# Patient Record
Sex: Female | Born: 1955
Health system: Southern US, Community
[De-identification: ages and names within clinical notes are randomized; demographics above are authoritative.]

## PROBLEM LIST (undated history)

## (undated) DIAGNOSIS — Z9889 Other specified postprocedural states: Secondary | ICD-10-CM

## (undated) DIAGNOSIS — R112 Nausea with vomiting, unspecified: Secondary | ICD-10-CM

## (undated) DIAGNOSIS — M810 Age-related osteoporosis without current pathological fracture: Secondary | ICD-10-CM

## (undated) DIAGNOSIS — E785 Hyperlipidemia, unspecified: Secondary | ICD-10-CM

## (undated) DIAGNOSIS — E2839 Other primary ovarian failure: Secondary | ICD-10-CM

## (undated) DIAGNOSIS — S92253A Displaced fracture of navicular [scaphoid] of unspecified foot, initial encounter for closed fracture: Secondary | ICD-10-CM

## (undated) HISTORY — PX: OTHER SURGICAL HISTORY: SHX169

## (undated) HISTORY — DX: Displaced fracture of navicular (scaphoid) of unspecified foot, initial encounter for closed fracture: S92.253A

## (undated) HISTORY — DX: Nausea with vomiting, unspecified: Z98.890

## (undated) HISTORY — DX: Other specified postprocedural states: R11.2

## (undated) HISTORY — PX: COLONOSCOPY: SHX174

## (undated) HISTORY — DX: Hyperlipidemia, unspecified: E78.5

## (undated) HISTORY — DX: Other primary ovarian failure: E28.39

## (undated) HISTORY — DX: Age-related osteoporosis without current pathological fracture: M81.0

---

## 2015-05-02 LAB — HM PAP SMEAR

## 2015-05-08 LAB — HM MAMMOGRAPHY

## 2016-05-08 LAB — HM MAMMOGRAPHY

## 2016-05-08 LAB — HM PAP SMEAR: HM PAP: NORMAL

## 2016-06-17 DIAGNOSIS — S92253A Displaced fracture of navicular [scaphoid] of unspecified foot, initial encounter for closed fracture: Secondary | ICD-10-CM

## 2016-06-17 HISTORY — DX: Displaced fracture of navicular (scaphoid) of unspecified foot, initial encounter for closed fracture: S92.253A

## 2017-10-13 ENCOUNTER — Encounter: Payer: Self-pay | Admitting: Family Medicine

## 2017-10-13 ENCOUNTER — Ambulatory Visit: Payer: 59 | Admitting: Family Medicine

## 2017-10-13 VITALS — BP 128/78 | HR 68 | Ht 65.5 in | Wt 117.4 lb

## 2017-10-13 DIAGNOSIS — E2839 Other primary ovarian failure: Secondary | ICD-10-CM | POA: Diagnosis not present

## 2017-10-13 DIAGNOSIS — Z1239 Encounter for other screening for malignant neoplasm of breast: Secondary | ICD-10-CM

## 2017-10-13 DIAGNOSIS — M8580 Other specified disorders of bone density and structure, unspecified site: Secondary | ICD-10-CM | POA: Diagnosis not present

## 2017-10-13 DIAGNOSIS — Z0000 Encounter for general adult medical examination without abnormal findings: Secondary | ICD-10-CM | POA: Diagnosis not present

## 2017-10-13 DIAGNOSIS — Z114 Encounter for screening for human immunodeficiency virus [HIV]: Secondary | ICD-10-CM

## 2017-10-13 DIAGNOSIS — Z1159 Encounter for screening for other viral diseases: Secondary | ICD-10-CM

## 2017-10-13 DIAGNOSIS — Z1231 Encounter for screening mammogram for malignant neoplasm of breast: Secondary | ICD-10-CM | POA: Diagnosis not present

## 2017-10-13 LAB — POCT URINALYSIS DIP (PROADVANTAGE DEVICE)
BILIRUBIN UA: NEGATIVE
GLUCOSE UA: NEGATIVE mg/dL
Ketones, POC UA: NEGATIVE mg/dL
LEUKOCYTES UA: NEGATIVE
NITRITE UA: NEGATIVE
PH UA: 8 (ref 5.0–8.0)
Protein Ur, POC: NEGATIVE mg/dL
RBC UA: NEGATIVE
Specific Gravity, Urine: 1.01
UUROB: 3.5

## 2017-10-13 NOTE — Progress Notes (Signed)
Subjective:    Patient ID: Deborah Wright, female    DOB: 1956/06/30, 61 y.o.   MRN: [id number]  HPI Chief Complaint  Patient presents with  . Annual Exam    fasting cpe   She is new to the practice and here for a complete physical exam. Previous medical care: OB/GYN in [place]: years ago.   Reports history of osteopenia years ago and has not had a repeat bone density study.  States she is having to do with aging parents in their decline in health but is not having to actually take care of them at this point. Denies feeling depressed or sad.  Reports having additional stress related to her parents health.  Other providers: Dr. Fabio Asa with [facility] - OB/GYN  Dr Glorious Peach- dentist   Social history: Lives with huband, works as Physiological scientist, Art gallery manager  Diet: healthy diet  Excerise: daily   Immunizations: flu shot last month. Tdap 2011 or 20112.    Health maintenance:  Mammogram: 2017  Colonoscopy: 6 -7 years ago  Last Pap Smear: 2017  DEXA- years ago. Osteopenia.  Last Menstrual cycle: 10 years ago Last Dental Exam: twice annually  Last Eye Exam: in [place] 2.5-3 years ago  Wears seatbelt always, uses sunscreen, smoke detectors in home and functioning, does not text while driving and feels safe in home environment.   Reviewed allergies, medications, past medical, surgical, family, and social history.    Review of Systems Review of Systems Constitutional: -fever, -chills, -sweats, -unexpected weight change,-fatigue ENT: -runny nose, -ear pain, -sore throat Cardiology:  -chest pain, -palpitations, -edema Respiratory: -cough, -shortness of breath, -wheezing Gastroenterology: -abdominal pain, -nausea, -vomiting, -diarrhea, -constipation  Hematology: -bleeding or bruising problems Musculoskeletal: -arthralgias, -myalgias, -joint swelling, -back pain Ophthalmology: -vision changes Urology: -dysuria, -difficulty  urinating, -hematuria, -urinary frequency, -urgency Neurology: -headache, -weakness, -tingling, -numbness       Objective:   Physical Exam BP 128/78   Pulse 68   Ht 5' 5.5" (1.664 m)   Wt 117 lb 6.4 oz (53.3 kg)   SpO2 99%   BMI 19.24 kg/m   General Appearance:    Alert, cooperative, thin, no distress, appears stated age  Head:    Normocephalic, without obvious abnormality, atraumatic  Eyes:    PERRL, conjunctiva/corneas clear, EOM's intact, fundi    benign  Ears:    Normal TM's and external ear canals  Nose:   Nares normal, mucosa normal, no drainage or sinus   tenderness  Throat:   Lips, mucosa, and tongue normal; teeth and gums normal  Neck:   Supple, no lymphadenopathy;  thyroid:  no   enlargement/tenderness/nodules; no carotid   bruit or JVD  Back:    Spine nontender, no curvature, ROM normal, no CVA     tenderness  Lungs:     Clear to auscultation bilaterally without wheezes, rales or     ronchi; respirations unlabored  Chest Wall:    No tenderness or deformity   Heart:    Regular rate and rhythm, S1 and S2 normal, no murmur, rub   or gallop  Breast Exam:   Declines.  Mammogram ordered.  Abdomen:     Soft, non-tender, nondistended, normoactive bowel sounds,    no masses, no hepatosplenomegaly  Genitalia:   Declines.  Pap smear up-to-date in 2018.  Rectal:    Normal tone, no masses or tenderness; guaiac negative stool  Extremities:   No clubbing, cyanosis or edema  Pulses:  2+ and symmetric all extremities  Skin:   Skin color, texture, turgor normal, no rashes or lesions  Lymph nodes:   Cervical, supraclavicular, and axillary nodes normal  Neurologic:   CNII-XII intact, normal strength, sensation and gait; reflexes 2+ and symmetric throughout          Psych:   Normal mood, affect, hygiene and grooming.     Urinalysis dipstick: neg       Assessment & Plan:  Routine general medical examination at a health care facility - Plan: CBC with Differential/Platelet,  Comprehensive metabolic panel, POCT Urinalysis DIP (Proadvantage Device), TSH, Lipid panel  Osteopenia determined by x-ray - Plan: DG Bone Density  Estrogen deficiency - Plan: DG Bone Density  Screening for breast cancer - Plan: MM DIGITAL SCREENING BILATERAL  Need for hepatitis C screening test - Plan: Hepatitis C antibody  Screening for HIV without presence of risk factors - Plan: HIV antibody  She appears to be doing well physically and emotionally.  Under stress due to parents declining health.  Offered counseling she declines. Mammogram ordered.  Bone density ordered.  She does have a history of osteopenia and needs reevaluated. Pap smear up-to-date per patient.  Will attempt to get records from her OB/GYN in [place]. Colonoscopy up-to-date per patient.  Will attempt to get these records. Discussed safety and health promotion. One-time hepatitis C screening test and HIV screening done per guidelines. Up-to-date with Tdap and flu shot. Counseled on shingles vaccine and she will check with her insurance. Follow-up pending labs.

## 2017-10-13 NOTE — Patient Instructions (Signed)

## 2017-10-14 ENCOUNTER — Telehealth: Payer: Self-pay | Admitting: Family Medicine

## 2017-10-14 LAB — COMPREHENSIVE METABOLIC PANEL
AG Ratio: 1.8 (calc) (ref 1.0–2.5)
ALT: 16 U/L (ref 6–29)
AST: 29 U/L (ref 10–35)
Albumin: 4.6 g/dL (ref 3.6–5.1)
Alkaline phosphatase (APISO): 55 U/L (ref 33–130)
BUN: 9 mg/dL (ref 7–25)
CALCIUM: 9.9 mg/dL (ref 8.6–10.4)
CO2: 31 mmol/L (ref 20–32)
Chloride: 96 mmol/L — ABNORMAL LOW (ref 98–110)
Creat: 0.89 mg/dL (ref 0.50–1.05)
GLUCOSE: 97 mg/dL (ref 65–99)
Globulin: 2.6 g/dL (calc) (ref 1.9–3.7)
Potassium: 4.7 mmol/L (ref 3.5–5.3)
Sodium: 135 mmol/L (ref 135–146)
TOTAL PROTEIN: 7.2 g/dL (ref 6.1–8.1)
Total Bilirubin: 1 mg/dL (ref 0.2–1.2)

## 2017-10-14 LAB — CBC WITH DIFFERENTIAL/PLATELET
BASOS ABS: 40 {cells}/uL (ref 0–200)
Basophils Relative: 0.7 %
EOS ABS: 103 {cells}/uL (ref 15–500)
EOS PCT: 1.8 %
HCT: 41.9 % (ref 35.0–45.0)
Hemoglobin: 14.7 g/dL (ref 11.7–15.5)
Lymphs Abs: 2155 cells/uL (ref 850–3900)
MCH: 31.5 pg (ref 27.0–33.0)
MCHC: 35.1 g/dL (ref 32.0–36.0)
MCV: 89.7 fL (ref 80.0–100.0)
MONOS PCT: 6.8 %
MPV: 10.4 fL (ref 7.5–12.5)
NEUTROS PCT: 52.9 %
Neutro Abs: 3015 cells/uL (ref 1500–7800)
Platelets: 203 10*3/uL (ref 140–400)
RBC: 4.67 10*6/uL (ref 3.80–5.10)
RDW: 12.1 % (ref 11.0–15.0)
TOTAL LYMPHOCYTE: 37.8 %
WBC mixed population: 388 cells/uL (ref 200–950)
WBC: 5.7 10*3/uL (ref 3.8–10.8)

## 2017-10-14 LAB — HEPATITIS C ANTIBODY
Hepatitis C Ab: NONREACTIVE
SIGNAL TO CUT-OFF: 0.01 (ref <1.00)

## 2017-10-14 LAB — HIV ANTIBODY (ROUTINE TESTING W REFLEX): HIV: NONREACTIVE

## 2017-10-14 LAB — LIPID PANEL
CHOLESTEROL: 233 mg/dL — AB (ref <200)
HDL: 104 mg/dL (ref >50)
LDL Cholesterol (Calc): 116 mg/dL (calc) — ABNORMAL HIGH
Non-HDL Cholesterol (Calc): 129 mg/dL (calc) (ref <130)
TRIGLYCERIDES: 51 mg/dL (ref <150)
Total CHOL/HDL Ratio: 2.2 (calc) (ref <5.0)

## 2017-10-14 LAB — TSH: TSH: 2.45 mIU/L (ref 0.40–4.50)

## 2017-10-14 NOTE — Telephone Encounter (Signed)
Records received from Dr. Halford Chessman [facility] Sent back in folder

## 2017-11-03 ENCOUNTER — Encounter: Payer: Self-pay | Admitting: Family Medicine

## 2017-11-10 ENCOUNTER — Ambulatory Visit
Admission: RE | Admit: 2017-11-10 | Discharge: 2017-11-10 | Disposition: A | Discharge status: Home / Self Care | Payer: 59 | Source: Ambulatory Visit | Attending: Family Medicine | Admitting: Family Medicine

## 2017-11-10 DIAGNOSIS — Z1239 Encounter for other screening for malignant neoplasm of breast: Secondary | ICD-10-CM

## 2017-11-10 DIAGNOSIS — Z1231 Encounter for screening mammogram for malignant neoplasm of breast: Secondary | ICD-10-CM | POA: Diagnosis not present

## 2017-11-10 DIAGNOSIS — E2839 Other primary ovarian failure: Secondary | ICD-10-CM

## 2017-11-10 DIAGNOSIS — M8580 Other specified disorders of bone density and structure, unspecified site: Secondary | ICD-10-CM

## 2017-11-10 DIAGNOSIS — M810 Age-related osteoporosis without current pathological fracture: Secondary | ICD-10-CM | POA: Diagnosis not present

## 2017-11-10 DIAGNOSIS — Z780 Asymptomatic menopausal state: Secondary | ICD-10-CM | POA: Diagnosis not present

## 2017-11-14 ENCOUNTER — Other Ambulatory Visit: Payer: Self-pay

## 2017-11-14 ENCOUNTER — Ambulatory Visit: Payer: Self-pay

## 2017-11-27 ENCOUNTER — Encounter: Payer: Self-pay | Admitting: Family Medicine

## 2017-12-28 DIAGNOSIS — M7022 Olecranon bursitis, left elbow: Secondary | ICD-10-CM | POA: Diagnosis not present

## 2017-12-31 DIAGNOSIS — D225 Melanocytic nevi of trunk: Secondary | ICD-10-CM | POA: Diagnosis not present

## 2017-12-31 DIAGNOSIS — L821 Other seborrheic keratosis: Secondary | ICD-10-CM | POA: Diagnosis not present

## 2017-12-31 DIAGNOSIS — D2262 Melanocytic nevi of left upper limb, including shoulder: Secondary | ICD-10-CM | POA: Diagnosis not present

## 2018-04-24 IMAGING — MG DIGITAL SCREENING BILATERAL MAMMOGRAM WITH CAD
5 series · 5 of 5 positions shown · non-contrast
Comparison: None.

ADDENDUM:
Patient's prior imaging has become available for comparison. There
has been no significant interval change. There is no change to the
current impression, recommendation or BI-RADS category.
CLINICAL DATA: Screening.

EXAM:
DIGITAL SCREENING BILATERAL MAMMOGRAM WITH CAD

[R CC]
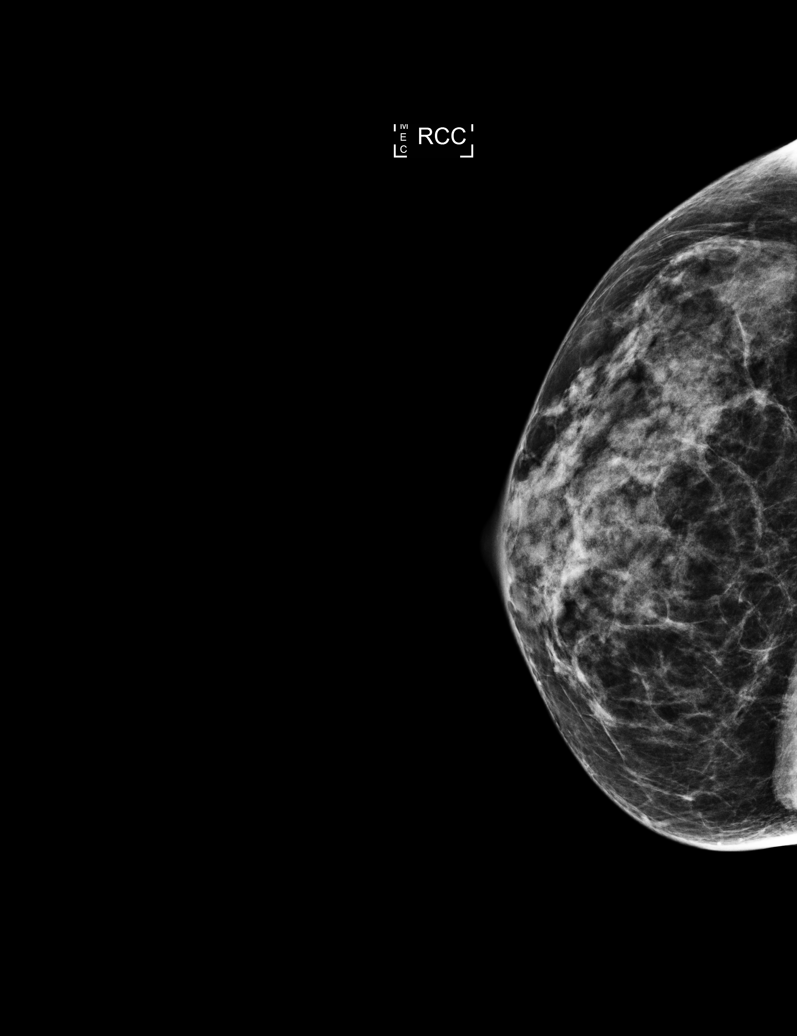

[R MLO (1 of 2)]
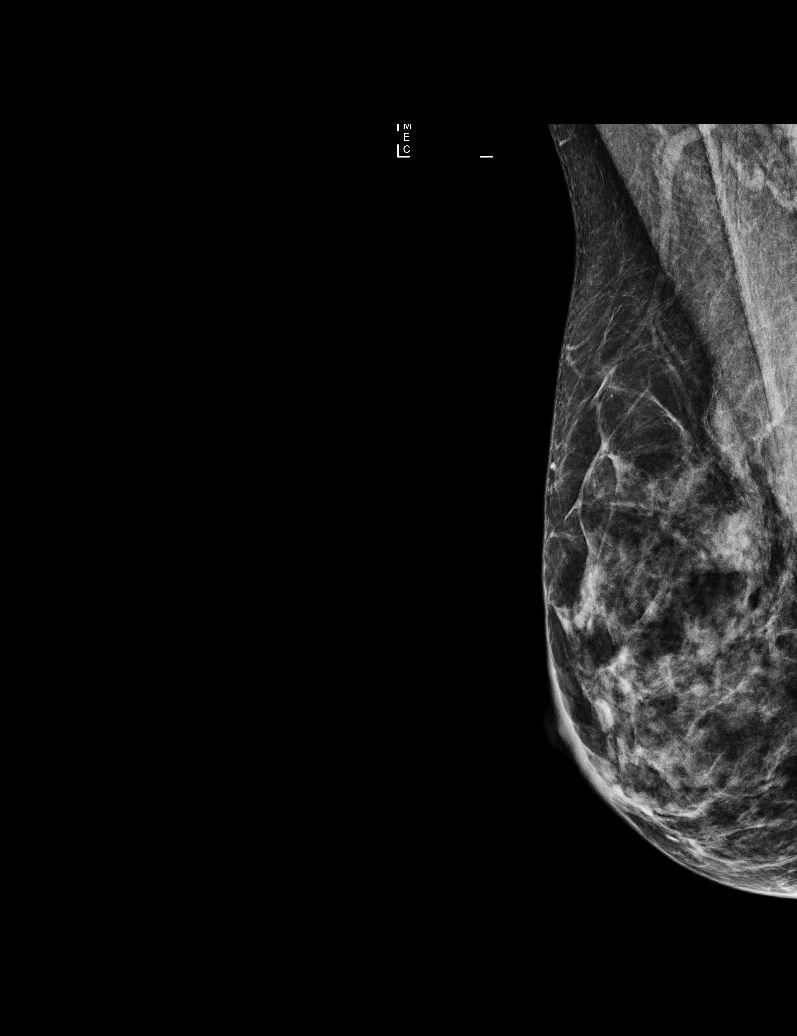

[R MLO (2 of 2)]
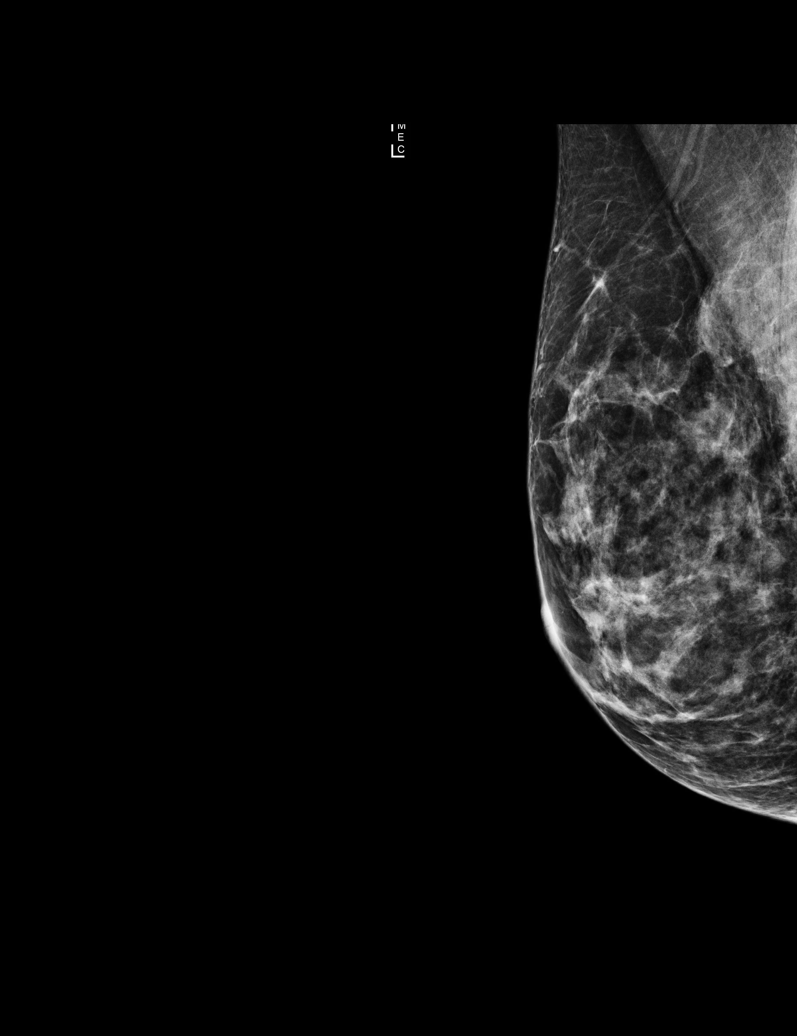

[L CC]
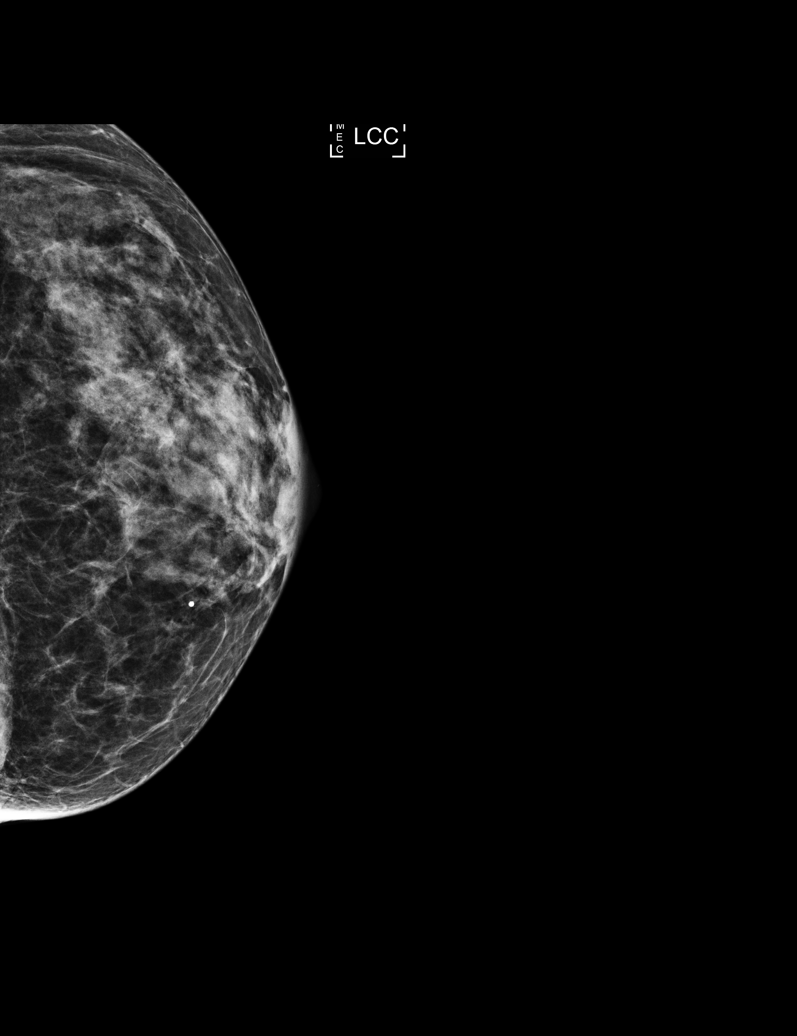

[L MLO]
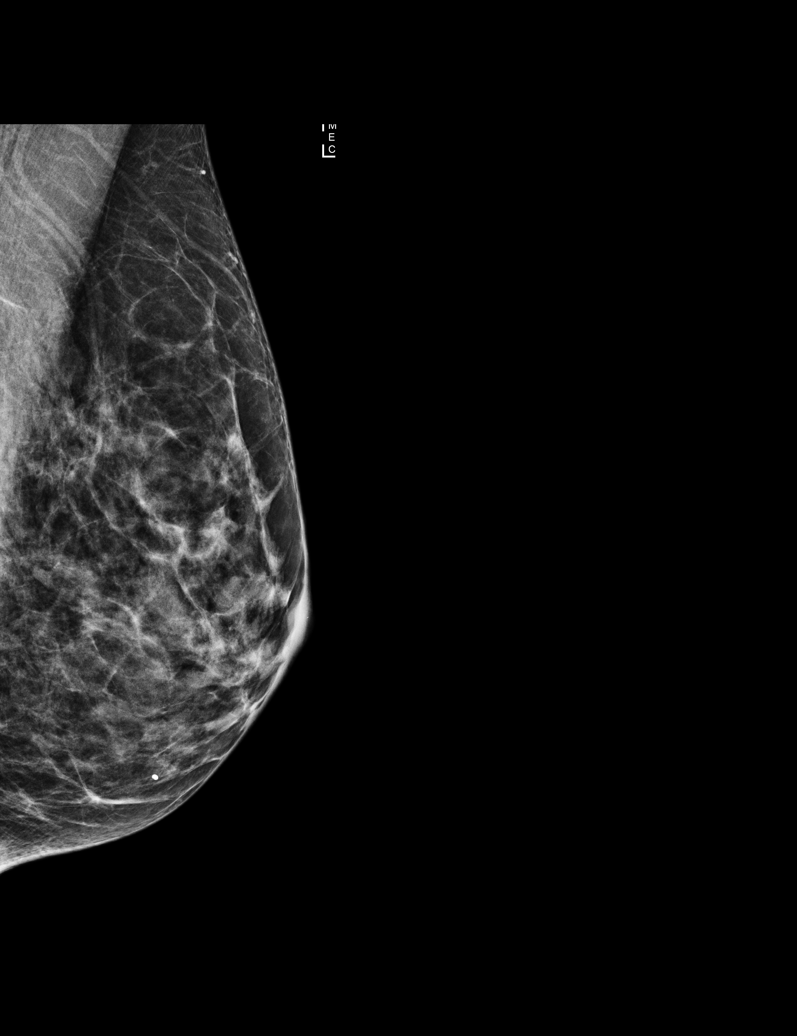

[5 of 5 positions shown; findings below may reference images not displayed]

ACR Breast Density Category c: The breast tissue is heterogeneously
dense, which may obscure small masses
FINDINGS: There are no findings suspicious for malignancy. Images were
processed with CAD.
IMPRESSION: No mammographic evidence of malignancy. A result letter of this
screening mammogram will be mailed directly to the patient.

RECOMMENDATION:
Screening mammogram in one year. (Code:T9-J-009)

BI-RADS CATEGORY  1: Negative.

## 2018-04-30 ENCOUNTER — Encounter: Payer: Self-pay | Admitting: Family Medicine

## 2018-05-14 DIAGNOSIS — H00014 Hordeolum externum left upper eyelid: Secondary | ICD-10-CM | POA: Diagnosis not present

## 2018-07-03 ENCOUNTER — Encounter: Payer: Self-pay | Admitting: Surgical

## 2018-07-27 NOTE — Progress Notes (Signed)
Deborah Wright is a 62 y.o. female is here to [place].   Patient Care Team: Briscoe Deutscher, DO as PCP - General (Family Medicine)   History of Present Illness:   HPI: Healthy. Teaches yoga. Eats well. Dx of OP but declines medication. Feels strong. Good balance.   Health Maintenance Due  Topic Date Due  . COLONOSCOPY  06/05/2008  . INFLUENZA VACCINE  06/18/2018   Depression screen PHQ 2/9 10/13/2017  Decreased Interest 1  Down, Depressed, Hopeless 1  PHQ - 2 Score 2   PMHx, SurgHx, SocialHx, Medications, and Allergies were reviewed in the Visit Navigator and updated as appropriate.   Past Medical History:  Diagnosis Date  . Closed fracture of navicular bone of foot 06/17/2016  . Estrogen deficiency   . Osteoporosis    History reviewed. No pertinent surgical history.   Family History  Problem Relation Age of Onset  . Pulmonary fibrosis Mother   . Osteoarthritis Mother   . Chronic Renal Failure Mother   . Osteoporosis Mother   . Arthritis Maternal Grandmother   . COPD Maternal Grandmother   . Alcohol abuse Maternal Grandfather   . Early death Maternal Grandfather   . Breast cancer Neg Hx    Social History   Tobacco Use  . Smoking status: Never Smoker  . Smokeless tobacco: Never Used  Substance Use Topics  . Alcohol use: Yes    Comment: 1-2 glasses of wine nightly   . Drug use: No    Current Medications and Allergies:   Current Outpatient Medications:  .  bimatoprost (LATISSE) 0.03 % ophthalmic solution, APPLY TO AFFECTED AREA EVERY DAY, Disp: , Rfl: 3   Allergies  Allergen Reactions  . Demerol [Meperidine] Nausea And Vomiting  . Versed [Midazolam] Nausea And Vomiting   Review of Systems:   Pertinent items are noted in the HPI. Otherwise, ROS is negative.  Vitals:   Vitals:   07/28/18 1500  BP: 104/66  Pulse: 70  Temp: 98.4 F (36.9 C)  TempSrc: Oral  SpO2: 98%  Weight: 114 lb 6.4 oz (51.9 kg)  Height: 5' 5.5" (1.664 m)     Body  mass index is 18.75 kg/m.  Physical Exam:   Physical Exam  Constitutional: She appears well-nourished.  HENT:  Head: Normocephalic and atraumatic.  Eyes: Pupils are equal, round, and reactive to light. EOM are normal.  Neck: Normal range of motion. Neck supple.  Cardiovascular: Normal rate, regular rhythm, normal heart sounds and intact distal pulses.  Pulmonary/Chest: Effort normal.  Abdominal: Soft.  Skin: Skin is warm.  Psychiatric: She has a normal mood and affect. Her behavior is normal.  Nursing note and vitals reviewed.  Results for orders placed or performed in visit on 11/03/17  HM PAP SMEAR  Result Value Ref Range   HM Pap smear norma    Assessment and Plan:   [name] was seen today for establish care.  Diagnoses and all orders for this visit:  Localized osteoporosis without current pathological fracture Comments: Doing well. Continue calcium and vitamin D. Continue strength training.     . Reviewed expectations re: course of current medical issues. . Discussed self-management of symptoms. . Outlined signs and symptoms indicating need for more acute intervention. . Patient verbalized understanding and all questions were answered. Marland Kitchen Health Maintenance issues including appropriate healthy diet, exercise, and smoking avoidance were discussed with patient. . See orders for this visit as documented in the electronic medical record. . Patient received an After  Visit Summary.  Briscoe Deutscher, DO , [facility] 08/03/2018

## 2018-07-28 ENCOUNTER — Encounter: Payer: Self-pay | Admitting: Family Medicine

## 2018-07-28 ENCOUNTER — Ambulatory Visit (INDEPENDENT_AMBULATORY_CARE_PROVIDER_SITE_OTHER): Payer: 59 | Admitting: Family Medicine

## 2018-07-28 VITALS — BP 104/66 | HR 70 | Temp 98.4°F | Ht 65.5 in | Wt 114.4 lb

## 2018-07-28 DIAGNOSIS — M816 Localized osteoporosis [Lequesne]: Secondary | ICD-10-CM | POA: Diagnosis not present

## 2018-08-03 ENCOUNTER — Encounter: Payer: Self-pay | Admitting: Family Medicine

## 2018-08-03 DIAGNOSIS — M816 Localized osteoporosis [Lequesne]: Secondary | ICD-10-CM | POA: Insufficient documentation

## 2019-01-05 ENCOUNTER — Other Ambulatory Visit: Payer: Self-pay | Admitting: Family Medicine

## 2019-01-05 DIAGNOSIS — Z1231 Encounter for screening mammogram for malignant neoplasm of breast: Secondary | ICD-10-CM

## 2019-01-07 DIAGNOSIS — L570 Actinic keratosis: Secondary | ICD-10-CM | POA: Diagnosis not present

## 2019-01-07 DIAGNOSIS — L821 Other seborrheic keratosis: Secondary | ICD-10-CM | POA: Diagnosis not present

## 2019-01-07 DIAGNOSIS — Q825 Congenital non-neoplastic nevus: Secondary | ICD-10-CM | POA: Diagnosis not present

## 2019-01-28 ENCOUNTER — Other Ambulatory Visit: Payer: Self-pay

## 2019-01-28 ENCOUNTER — Ambulatory Visit
Admission: RE | Admit: 2019-01-28 | Discharge: 2019-01-28 | Disposition: A | Discharge status: Home / Self Care | Payer: 59 | Source: Ambulatory Visit | Attending: Family Medicine | Admitting: Family Medicine

## 2019-01-28 DIAGNOSIS — Z1231 Encounter for screening mammogram for malignant neoplasm of breast: Secondary | ICD-10-CM

## 2019-03-30 ENCOUNTER — Encounter: Payer: Self-pay | Admitting: Family Medicine

## 2019-03-31 ENCOUNTER — Other Ambulatory Visit: Payer: Self-pay

## 2019-03-31 ENCOUNTER — Ambulatory Visit (INDEPENDENT_AMBULATORY_CARE_PROVIDER_SITE_OTHER): Payer: 59 | Admitting: Family Medicine

## 2019-03-31 ENCOUNTER — Encounter: Payer: Self-pay | Admitting: Family Medicine

## 2019-03-31 VITALS — Ht 65.5 in | Wt 114.0 lb

## 2019-03-31 DIAGNOSIS — L255 Unspecified contact dermatitis due to plants, except food: Secondary | ICD-10-CM | POA: Diagnosis not present

## 2019-03-31 MED ORDER — CLOBETASOL PROPIONATE 0.05 % EX OINT: 1.0000 "application " | TOPICAL_OINTMENT | Freq: Two times a day (BID) | CUTANEOUS | 15 refills | Status: DC

## 2019-03-31 NOTE — Progress Notes (Signed)
Virtual Visit via Video   Due to the COVID-19 pandemic, this visit was completed with telemedicine (audio/video) technology to reduce patient and provider exposure as well as to preserve personal protective equipment.   I connected with Haiven Nardone by a video enabled telemedicine application and verified that I am speaking with the correct person using two identifiers. Location patient: Home Location provider: [facility], Office Persons participating in the virtual visit: Zena Vitelli, Briscoe Deutscher, DO Lonell Grandchild, CMA acting as scribe for Dr. Briscoe Deutscher.   I discussed the limitations of evaluation and management by telemedicine and the availability of in person appointments. The patient expressed understanding and agreed to proceed.  Care Team   Patient Care Team: Briscoe Deutscher, DO as PCP - General (Family Medicine)  Subjective:   HPI: Patient has been having issues with it for over three weeks. She has been working out in the yard and thinks that she is continuing to have cross contamination. She has several small spots on face, back, leg, and hand.   Since being home more & working out in the yard I have, again, run into poison ivy.  It's not much - a few blisters on hand, a few more on ankle, and some on back/neck. And a couple small spots on face.  I bought some OTC Cortisol 10 but it doesn't do much in terms of helping to clear it up. Only curb the itch.   Review of Systems  Constitutional: Negative for chills and fever.  HENT: Negative for hearing loss and tinnitus.   Eyes: Negative for blurred vision and double vision.  Respiratory: Negative for cough.   Cardiovascular: Negative for chest pain.  Gastrointestinal: Negative for heartburn and nausea.  Genitourinary: Negative for dysuria.  Musculoskeletal: Negative for myalgias.  Skin: Negative for rash.  Neurological: Negative for dizziness and headaches.  Endo/Heme/Allergies: Does not bruise/bleed easily.    Psychiatric/Behavioral: Negative for depression and suicidal ideas.     Patient Active Problem List   Diagnosis Date Noted  . Localized osteoporosis without current pathological fracture 08/03/2018  . Estrogen deficiency   . Closed fracture of navicular bone of foot 06/17/2016    Social History   Tobacco Use  . Smoking status: Never Smoker  . Smokeless tobacco: Never Used  Substance Use Topics  . Alcohol use: Yes    Comment: 1-2 glasses of wine nightly     Current Outpatient Medications:  .  bimatoprost (LATISSE) 0.03 % ophthalmic solution, APPLY TO AFFECTED AREA EVERY DAY, Disp: , Rfl: 3 .  clobetasol ointment (TEMOVATE) 3.71 %, Apply 1 application topically 2 (two) times daily., Disp: 60 g, Rfl: 15  Allergies  Allergen Reactions  . Demerol [Meperidine] Nausea And Vomiting  . Versed [Midazolam] Nausea And Vomiting    Objective:   VITALS: Per patient if applicable, see vitals. GENERAL: Alert, appears well and in no acute distress. HEENT: Atraumatic, conjunctiva clear, no obvious abnormalities on inspection of external nose and ears. NECK: Normal movements of the head and neck. CARDIOPULMONARY: No increased WOB. Speaking in clear sentences. I:E ratio WNL.  MS: Moves all visible extremities without noticeable abnormality. PSYCH: Pleasant and cooperative, well-groomed. Speech normal rate and rhythm. Affect is appropriate. Insight and judgement are appropriate. Attention is focused, linear, and appropriate.  NEURO: CN grossly intact. Oriented as arrived to appointment on time with no prompting. Moves both UE equally.  SKIN: No obvious lesions, wounds, erythema, or cyanosis noted on face or hands.  Depression screen PHQ  2/9 10/13/2017  Decreased Interest 1  Down, Depressed, Hopeless 1  PHQ - 2 Score 2    Assessment and Plan:   Nelda was seen today for poison ivy.  Diagnoses and all orders for this visit:  Plant dermatitis -     clobetasol ointment (TEMOVATE) 0.05 %;  Apply 1 application topically 2 (two) times daily.   Marland Kitchen COVID-19 Education: The signs and symptoms of COVID-19 were discussed with the patient and how to seek care for testing if needed. The importance of social distancing was discussed today. . Reviewed expectations re: course of current medical issues. . Discussed self-management of symptoms. . Outlined signs and symptoms indicating need for more acute intervention. . Patient verbalized understanding and all questions were answered. Marland Kitchen Health Maintenance issues including appropriate healthy diet, exercise, and smoking avoidance were discussed with patient. . See orders for this visit as documented in the electronic medical record.  Briscoe Deutscher, DO  Records requested if needed. Time spent: 25 minutes, of which >50% was spent in obtaining information about her symptoms, reviewing her previous labs, evaluations, and treatments, counseling her about her condition (please see the discussed topics above), and developing a plan to further investigate it; she had a number of questions which I addressed.

## 2019-06-27 NOTE — Progress Notes (Signed)
Subjective:    Deborah Wright is a 63 y.o. female and is here for a comprehensive physical exam. Healthy. Eats well. Dx of OP but declines medication.  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  With COVID restrictions, she has been able to do those.  She does feel somewhat weaker as well since she cannot strength training at the [facility].  She does drink wine 1 to 2 glasses each night with dinner.  She has been doing this for very long time.  Health Maintenance Due  Topic Date Due  . COLONOSCOPY  06/05/2008  . PAP SMEAR-Modifier  05/09/2019  . INFLUENZA VACCINE  06/19/2019    Current Outpatient Medications:  .  b complex vitamins tablet, Take 1 tablet by mouth daily., Disp: , Rfl:  .  bimatoprost (LATISSE) 0.03 % ophthalmic solution, APPLY TO AFFECTED AREA EVERY DAY, Disp: , Rfl: 3 .  cholecalciferol (VITAMIN D3) 25 MCG (1000 UT) tablet, Take 1,000 Units by mouth daily., Disp: , Rfl:  .  clobetasol ointment (TEMOVATE) 9.83 %, Apply 1 application topically 2 (two) times daily., Disp: 60 g, Rfl: 15  PMHx, SurgHx, SocialHx, Medications, and Allergies were reviewed in the Visit Navigator and updated as appropriate.   Past Medical History:  Diagnosis Date  . Closed fracture of navicular bone of foot 06/17/2016  . Estrogen deficiency   . Osteoporosis    History reviewed. No pertinent surgical history.   Family History  Problem Relation Age of Onset  . Pulmonary fibrosis Mother   . Osteoarthritis Mother   . Chronic Renal Failure Mother   . Osteoporosis Mother   . Arthritis Maternal Grandmother   . COPD Maternal Grandmother   . Alcohol abuse Maternal Grandfather   . Early death Maternal Grandfather   . Breast cancer Neg Hx     Social History   Tobacco Use  . Smoking status: Never Smoker  . Smokeless tobacco: Never Used  Substance Use Topics  . Alcohol use: Yes    Comment: 1-2 glasses of wine nightly   . Drug use: No    Review of Systems:   Pertinent items are  noted in the HPI. Otherwise, ROS is negative.  Objective:   BP 118/78 (BP Location: Left Arm, Patient Position: Sitting, Cuff Size: Normal)   Pulse 72   Temp 98.4 F (36.9 C) (Temporal)   Ht 5' 4.25" (1.632 m)   Wt 116 lb 3.2 oz (52.7 kg)   SpO2 99%   BMI 19.79 kg/m   General appearance: alert, cooperative and appears stated age. Head: normocephalic, without obvious abnormality, atraumatic. Neck: no adenopathy, supple, symmetrical, trachea midline; thyroid not enlarged, symmetric, no tenderness/mass/nodules. Lungs: clear to auscultation bilaterally, cough Heart: regular rate and rhythm Abdomen: soft, non-tender; no masses,  no organomegaly. Extremities: extremities normal, atraumatic, no cyanosis or edema. Skin: skin color, texture, turgor normal, no rashes or lesions. Lymph: cervical, supraclavicular, and axillary nodes normal; no abnormal inguinal nodes palpated. Neurologic: grossly normal.  Assessment/Plan:   [name] was seen today for annual exam and fatigue.  Diagnoses and all orders for this visit:  Routine physical examination  Localized osteoporosis without current pathological fracture -     DG Bone Density; Future  Pure hypercholesterolemia -     Lipid panel  Cough -     DG Chest 2 View  Fatigue, unspecified type -     CBC with Differential/Platelet -     Comprehensive metabolic panel -     Magnesium -  TSH -     Vitamin B12  Vitamin D deficiency -     VITAMIN D 25 Hydroxy (Vit-D Deficiency, Fractures)   Patient Counseling: [x]    Nutrition: Stressed importance of moderation in sodium/caffeine intake, saturated fat and cholesterol, caloric balance, sufficient intake of fresh fruits, vegetables, fiber, calcium, iron, and 1 mg of folate supplement per day (for females capable of pregnancy).  [x]    Stressed the importance of regular exercise.   [x]    Substance Abuse: Discussed cessation/primary prevention of tobacco, alcohol, or other drug use; driving  or other dangerous activities under the influence; availability of treatment for abuse.   [x]    Injury prevention: Discussed safety belts, safety helmets, smoke detector, smoking near bedding or upholstery.   [x]    Sexuality: Discussed sexually transmitted diseases, partner selection, use of condoms, avoidance of unintended pregnancy  and contraceptive alternatives.  [x]    Dental health: Discussed importance of regular tooth brushing, flossing, and dental visits.  [x]    Health maintenance and immunizations reviewed. Please refer to Health maintenance section.   Briscoe Deutscher, DO [place]

## 2019-06-28 ENCOUNTER — Ambulatory Visit (INDEPENDENT_AMBULATORY_CARE_PROVIDER_SITE_OTHER): Payer: 59 | Admitting: Family Medicine

## 2019-06-28 ENCOUNTER — Encounter: Payer: Self-pay | Admitting: Family Medicine

## 2019-06-28 ENCOUNTER — Other Ambulatory Visit: Payer: Self-pay

## 2019-06-28 ENCOUNTER — Ambulatory Visit (INDEPENDENT_AMBULATORY_CARE_PROVIDER_SITE_OTHER): Payer: 59

## 2019-06-28 VITALS — BP 118/78 | HR 72 | Temp 98.4°F | Ht 64.25 in | Wt 116.2 lb

## 2019-06-28 DIAGNOSIS — M816 Localized osteoporosis [Lequesne]: Secondary | ICD-10-CM | POA: Diagnosis not present

## 2019-06-28 DIAGNOSIS — E559 Vitamin D deficiency, unspecified: Secondary | ICD-10-CM

## 2019-06-28 DIAGNOSIS — R059 Cough, unspecified: Secondary | ICD-10-CM

## 2019-06-28 DIAGNOSIS — Z0000 Encounter for general adult medical examination without abnormal findings: Secondary | ICD-10-CM

## 2019-06-28 DIAGNOSIS — R5383 Other fatigue: Secondary | ICD-10-CM

## 2019-06-28 DIAGNOSIS — R05 Cough: Secondary | ICD-10-CM

## 2019-06-28 DIAGNOSIS — E7800 Pure hypercholesterolemia, unspecified: Secondary | ICD-10-CM | POA: Diagnosis not present

## 2019-06-28 LAB — MAGNESIUM: Magnesium: 2.1 mg/dL (ref 1.5–2.5)

## 2019-06-28 LAB — CBC WITH DIFFERENTIAL/PLATELET
Basophils Absolute: 0 10*3/uL (ref 0.0–0.1)
Basophils Relative: 0.8 % (ref 0.0–3.0)
Eosinophils Absolute: 0.1 10*3/uL (ref 0.0–0.7)
Eosinophils Relative: 2.4 % (ref 0.0–5.0)
HCT: 42.9 % (ref 36.0–46.0)
Hemoglobin: 14.5 g/dL (ref 12.0–15.0)
Lymphocytes Relative: 36.1 % (ref 12.0–46.0)
Lymphs Abs: 1.3 10*3/uL (ref 0.7–4.0)
MCHC: 33.8 g/dL (ref 30.0–36.0)
MCV: 92 fl (ref 78.0–100.0)
Monocytes Absolute: 0.3 10*3/uL (ref 0.1–1.0)
Monocytes Relative: 7.6 % (ref 3.0–12.0)
Neutro Abs: 1.8 10*3/uL (ref 1.4–7.7)
Neutrophils Relative %: 53.1 % (ref 43.0–77.0)
Platelets: 177 10*3/uL (ref 150.0–400.0)
RBC: 4.66 Mil/uL (ref 3.87–5.11)
RDW: 12.7 % (ref 11.5–15.5)
WBC: 3.5 10*3/uL — ABNORMAL LOW (ref 4.0–10.5)

## 2019-06-28 LAB — LIPID PANEL
Cholesterol: 233 mg/dL — ABNORMAL HIGH (ref 0–200)
HDL: 79.2 mg/dL (ref >39.00)
LDL Cholesterol: 136 mg/dL — ABNORMAL HIGH (ref 0–99)
NonHDL: 154.22
Total CHOL/HDL Ratio: 3
Triglycerides: 92 mg/dL (ref 0.0–149.0)
VLDL: 18.4 mg/dL (ref 0.0–40.0)

## 2019-06-28 LAB — COMPREHENSIVE METABOLIC PANEL
ALT: 15 U/L (ref 0–35)
AST: 24 U/L (ref 0–37)
Albumin: 4.4 g/dL (ref 3.5–5.2)
Alkaline Phosphatase: 75 U/L (ref 39–117)
BUN: 13 mg/dL (ref 6–23)
CO2: 30 mEq/L (ref 19–32)
Calcium: 9.5 mg/dL (ref 8.4–10.5)
Chloride: 98 mEq/L (ref 96–112)
Creatinine, Ser: 0.84 mg/dL (ref 0.40–1.20)
GFR: 68.92 mL/min (ref >60.00)
Glucose, Bld: 93 mg/dL (ref 70–99)
Potassium: 4.7 mEq/L (ref 3.5–5.1)
Sodium: 136 mEq/L (ref 135–145)
Total Bilirubin: 0.9 mg/dL (ref 0.2–1.2)
Total Protein: 6.8 g/dL (ref 6.0–8.3)

## 2019-06-28 LAB — TSH: TSH: 1.5 u[IU]/mL (ref 0.35–4.50)

## 2019-06-28 LAB — VITAMIN D 25 HYDROXY (VIT D DEFICIENCY, FRACTURES): VITD: 46.68 ng/mL (ref 30.00–100.00)

## 2019-06-28 LAB — VITAMIN B12: Vitamin B-12: 223 pg/mL (ref 211–911)

## 2019-06-28 NOTE — Patient Instructions (Signed)
Health Maintenance Due  Topic Date Due  . COLONOSCOPY  06/05/2008  . PAP SMEAR-Modifier  05/09/2019  . INFLUENZA VACCINE  06/19/2019    Depression screen [facility] 2/9 06/28/2019 10/13/2017  Decreased Interest 1 1  Down, Depressed, Hopeless 1 1  PHQ - 2 Score 2 2  Altered sleeping 0 -  Tired, decreased energy 1 -  Change in appetite 1 -  Feeling bad or failure about yourself  1 -  Trouble concentrating 0 -  Moving slowly or fidgety/restless 0 -  Suicidal thoughts 0 -  PHQ-9 Score 5 -  Difficult doing work/chores Not difficult at all -

## 2019-07-07 ENCOUNTER — Ambulatory Visit (INDEPENDENT_AMBULATORY_CARE_PROVIDER_SITE_OTHER): Payer: 59

## 2019-07-07 ENCOUNTER — Other Ambulatory Visit: Payer: Self-pay

## 2019-07-07 DIAGNOSIS — E538 Deficiency of other specified B group vitamins: Secondary | ICD-10-CM | POA: Diagnosis not present

## 2019-07-07 MED ORDER — CYANOCOBALAMIN 1000 MCG/ML IJ SOLN
1000.0000 ug | Freq: Once | INTRAMUSCULAR | Status: AC
  Administered 2019-07-07: 1000 ug via INTRAMUSCULAR

## 2019-07-07 NOTE — Patient Instructions (Signed)
Health Maintenance Due  Topic Date Due  . COLONOSCOPY  06/05/2008  . PAP SMEAR-Modifier  05/09/2019  . INFLUENZA VACCINE  06/19/2019    Depression screen [facility] 2/9 06/28/2019 10/13/2017  Decreased Interest 1 1  Down, Depressed, Hopeless 1 1  PHQ - 2 Score 2 2  Altered sleeping 0 -  Tired, decreased energy 1 -  Change in appetite 1 -  Feeling bad or failure about yourself  1 -  Trouble concentrating 0 -  Moving slowly or fidgety/restless 0 -  Suicidal thoughts 0 -  PHQ-9 Score 5 -  Difficult doing work/chores Not difficult at all -

## 2019-07-07 NOTE — Progress Notes (Signed)
Per orders of Inda Coke, [place], injection of B12 injection given by Verline Lema L Keaston Pile in left deltoid. Patient tolerated injection well. Patient will make appointment for 1week.

## 2019-07-14 ENCOUNTER — Other Ambulatory Visit: Payer: Self-pay

## 2019-07-14 ENCOUNTER — Ambulatory Visit (INDEPENDENT_AMBULATORY_CARE_PROVIDER_SITE_OTHER): Payer: 59

## 2019-07-14 ENCOUNTER — Ambulatory Visit: Payer: 59

## 2019-07-14 DIAGNOSIS — E538 Deficiency of other specified B group vitamins: Secondary | ICD-10-CM

## 2019-07-14 MED ORDER — CYANOCOBALAMIN 1000 MCG/ML IJ SOLN
1000.0000 ug | Freq: Once | INTRAMUSCULAR | Status: AC
  Administered 2019-07-14: 11:00:00 1000 ug via INTRAMUSCULAR

## 2019-07-14 NOTE — Progress Notes (Signed)
Per orders of Dr. Wallace, injection of vitamin B12 1000 mcg given in right deltoid by Amber Agner, CMA. Patient tolerated injection well.  

## 2019-07-21 ENCOUNTER — Ambulatory Visit (INDEPENDENT_AMBULATORY_CARE_PROVIDER_SITE_OTHER): Payer: 59

## 2019-07-21 ENCOUNTER — Other Ambulatory Visit: Payer: Self-pay

## 2019-07-21 DIAGNOSIS — E538 Deficiency of other specified B group vitamins: Secondary | ICD-10-CM

## 2019-07-21 MED ORDER — CYANOCOBALAMIN 1000 MCG/ML IJ SOLN
1000.0000 ug | Freq: Once | INTRAMUSCULAR | Status: AC
  Administered 2019-07-21: 1000 ug via INTRAMUSCULAR

## 2019-07-21 NOTE — Progress Notes (Signed)
Per orders of Dr. Wallace, injection of vitamin B12 1000 mcg given in left deltoid by Kimbra Marcelino, CMA.  Patient tolerated injection well.  

## 2019-07-29 ENCOUNTER — Ambulatory Visit (INDEPENDENT_AMBULATORY_CARE_PROVIDER_SITE_OTHER): Payer: 59 | Admitting: *Deleted

## 2019-07-29 ENCOUNTER — Encounter: Payer: Self-pay | Admitting: *Deleted

## 2019-07-29 ENCOUNTER — Other Ambulatory Visit: Payer: Self-pay

## 2019-07-29 DIAGNOSIS — E538 Deficiency of other specified B group vitamins: Secondary | ICD-10-CM

## 2019-07-29 MED ORDER — CYANOCOBALAMIN 1000 MCG/ML IJ SOLN
1000.0000 ug | Freq: Once | INTRAMUSCULAR | Status: AC
  Administered 2019-07-29: 15:00:00 1000 ug via INTRAMUSCULAR

## 2019-07-29 NOTE — Progress Notes (Signed)
I have reviewed and agree with note, evaluation, plan.   Shirle Provencal, MD  

## 2019-07-29 NOTE — Progress Notes (Signed)
Per orders of Dr. Yong Channel, injection of Cyanocobalamin 1000 mcg  given by Anselmo Pickler in right deltoid. Patient tolerated injection well. Patient will make appointment in one week to have VIt B12 checked per Dr. Juleen China after 4 B 12 injections.

## 2019-08-06 ENCOUNTER — Other Ambulatory Visit: Payer: Self-pay

## 2019-08-06 ENCOUNTER — Other Ambulatory Visit (INDEPENDENT_AMBULATORY_CARE_PROVIDER_SITE_OTHER): Payer: 59

## 2019-08-06 DIAGNOSIS — E538 Deficiency of other specified B group vitamins: Secondary | ICD-10-CM | POA: Diagnosis not present

## 2019-08-06 DIAGNOSIS — R5383 Other fatigue: Secondary | ICD-10-CM

## 2019-08-06 LAB — CBC WITH DIFFERENTIAL/PLATELET
Basophils Absolute: 0 10*3/uL (ref 0.0–0.1)
Basophils Relative: 1 % (ref 0.0–3.0)
Eosinophils Absolute: 0.1 10*3/uL (ref 0.0–0.7)
Eosinophils Relative: 3.5 % (ref 0.0–5.0)
HCT: 43.6 % (ref 36.0–46.0)
Hemoglobin: 14.5 g/dL (ref 12.0–15.0)
Lymphocytes Relative: 43.7 % (ref 12.0–46.0)
Lymphs Abs: 1.2 10*3/uL (ref 0.7–4.0)
MCHC: 33.2 g/dL (ref 30.0–36.0)
MCV: 92.7 fl (ref 78.0–100.0)
Monocytes Absolute: 0.3 10*3/uL (ref 0.1–1.0)
Monocytes Relative: 9.4 % (ref 3.0–12.0)
Neutro Abs: 1.2 10*3/uL — ABNORMAL LOW (ref 1.4–7.7)
Neutrophils Relative %: 42.4 % — ABNORMAL LOW (ref 43.0–77.0)
Platelets: 189 10*3/uL (ref 150.0–400.0)
RBC: 4.7 Mil/uL (ref 3.87–5.11)
RDW: 13.2 % (ref 11.5–15.5)
WBC: 2.8 10*3/uL — ABNORMAL LOW (ref 4.0–10.5)

## 2019-08-06 LAB — VITAMIN B12: Vitamin B-12: 500 pg/mL (ref 211–911)

## 2019-08-09 ENCOUNTER — Telehealth: Payer: Self-pay | Admitting: Family Medicine

## 2019-08-09 NOTE — Telephone Encounter (Signed)
Patient is calling back for lab results [phone]

## 2019-08-10 ENCOUNTER — Encounter: Payer: Self-pay | Admitting: Family Medicine

## 2019-08-10 ENCOUNTER — Other Ambulatory Visit: Payer: Self-pay

## 2019-08-10 DIAGNOSIS — R5383 Other fatigue: Secondary | ICD-10-CM

## 2019-08-10 NOTE — Progress Notes (Signed)
amb  

## 2019-08-10 NOTE — Telephone Encounter (Signed)
I spoke with patient and informed her of lab results.  Patient verbalized understanding and wanted the referral to be started for hematology evaluation.  Pt said that she would check with her insurance to see if the preferred a certain Hematology office and will call back with that information if so.

## 2019-08-11 ENCOUNTER — Telehealth: Payer: Self-pay | Admitting: Adult Health

## 2019-08-11 NOTE — Telephone Encounter (Signed)
Received a new hem referral from Dr. Briscoe Deutscher at [facility] for low wbc/fatigue. Deborah Wright has been cld and scheduled to see [name] on 9/30 at 1230pm She's been made aware to arrive 15 minutes early.

## 2019-08-17 ENCOUNTER — Other Ambulatory Visit: Payer: 59

## 2019-08-17 ENCOUNTER — Other Ambulatory Visit: Payer: Self-pay | Admitting: Adult Health

## 2019-08-17 DIAGNOSIS — D708 Other neutropenia: Secondary | ICD-10-CM

## 2019-08-18 ENCOUNTER — Inpatient Hospital Stay: Payer: 59

## 2019-08-18 ENCOUNTER — Other Ambulatory Visit: Payer: Self-pay

## 2019-08-18 ENCOUNTER — Encounter: Payer: Self-pay | Admitting: Adult Health

## 2019-08-18 ENCOUNTER — Inpatient Hospital Stay: Payer: 59 | Attending: Adult Health | Admitting: Adult Health

## 2019-08-18 VITALS — BP 125/80 | HR 60 | Temp 98.0°F | Resp 18 | Ht 64.25 in | Wt 117.6 lb

## 2019-08-18 DIAGNOSIS — E538 Deficiency of other specified B group vitamins: Secondary | ICD-10-CM | POA: Diagnosis not present

## 2019-08-18 DIAGNOSIS — D708 Other neutropenia: Secondary | ICD-10-CM

## 2019-08-18 DIAGNOSIS — D72819 Decreased white blood cell count, unspecified: Secondary | ICD-10-CM | POA: Diagnosis not present

## 2019-08-18 LAB — CBC WITH DIFFERENTIAL (CANCER CENTER ONLY)
Abs Immature Granulocytes: 0.02 10*3/uL (ref 0.00–0.07)
Basophils Absolute: 0 10*3/uL (ref 0.0–0.1)
Basophils Relative: 1 %
Eosinophils Absolute: 0.1 10*3/uL (ref 0.0–0.5)
Eosinophils Relative: 1 %
HCT: 42.7 % (ref 36.0–46.0)
Hemoglobin: 14 g/dL (ref 12.0–15.0)
Immature Granulocytes: 1 %
Lymphocytes Relative: 36 %
Lymphs Abs: 1.6 10*3/uL (ref 0.7–4.0)
MCH: 30.5 pg (ref 26.0–34.0)
MCHC: 32.8 g/dL (ref 30.0–36.0)
MCV: 93 fL (ref 80.0–100.0)
Monocytes Absolute: 0.3 10*3/uL (ref 0.1–1.0)
Monocytes Relative: 7 %
Neutro Abs: 2.4 10*3/uL (ref 1.7–7.7)
Neutrophils Relative %: 54 %
Platelet Count: 177 10*3/uL (ref 150–400)
RBC: 4.59 MIL/uL (ref 3.87–5.11)
RDW: 11.9 % (ref 11.5–15.5)
WBC Count: 4.4 10*3/uL (ref 4.0–10.5)
nRBC: 0 % (ref 0.0–0.2)

## 2019-08-18 LAB — CMP (CANCER CENTER ONLY)
ALT: 16 U/L (ref 0–44)
AST: 26 U/L (ref 15–41)
Albumin: 4.2 g/dL (ref 3.5–5.0)
Alkaline Phosphatase: 75 U/L (ref 38–126)
Anion gap: 11 (ref 5–15)
BUN: 12 mg/dL (ref 8–23)
CO2: 28 mmol/L (ref 22–32)
Calcium: 9 mg/dL (ref 8.9–10.3)
Chloride: 97 mmol/L — ABNORMAL LOW (ref 98–111)
Creatinine: 0.94 mg/dL (ref 0.44–1.00)
GFR, Est AFR Am: >60 mL/min (ref >60)
GFR, Estimated: >60 mL/min (ref >60)
Glucose, Bld: 100 mg/dL — ABNORMAL HIGH (ref 70–99)
Potassium: 4.7 mmol/L (ref 3.5–5.1)
Sodium: 136 mmol/L (ref 135–145)
Total Bilirubin: 0.6 mg/dL (ref 0.3–1.2)
Total Protein: 6.9 g/dL (ref 6.5–8.1)

## 2019-08-18 LAB — SAVE SMEAR(SSMR), FOR PROVIDER SLIDE REVIEW

## 2019-08-18 LAB — SEDIMENTATION RATE: Sed Rate: 0 mm/hr (ref 0–22)

## 2019-08-18 NOTE — Progress Notes (Addendum)
[facility]  [phone] [phone]     ID: Deborah Wright DOB: 31-Jan-1956  MR#: [id number]  FXT#:[id number]  Patient Care Team: Briscoe Deutscher, DO as PCP - General (Family Medicine) Scot Dock, NP OTHER MD:  CHIEF COMPLAINT: neutropenia, B-12 deficiency  CURRENT TREATMENT: observation   HISTORY OF CURRENT ILLNESS:  Deborah Wright notes that several weeks ago, she went into see her PCP for a routine physical and complained of some new fatigue she thought might be aging related.  Lab evaluation at that time revealed a b12 deficiency.  She was started on weekly B12 injections and her level went from 211 to 500.  Her fatigue improved.  At the time of her initial labs, her WBC was decreased to 3.5 and her neutrophils were normal.  On repeat of these labs after four weeks of waiting, her WBC decreased again to 2.8 and her ANC was 1.2.  She was referred to [place] for evaluation.    The patient's subsequent history is as detailed below.  INTERVAL HISTORY: Deborah Wright is doing well today.  She has no issues.  She notes her fatigue is improved and she tolerated her b12 injections well.  She denies any new issues, and tells me she has been healthy most of her life.  She is in the process of finding a new PCP, as her current PCP is transitioning to community health and wellness.    REVIEW OF SYSTEMS: Deborah Wright exercises regularly.  She denies any fever or chills.  She is without any chest pain, palpitations, cough, or shortness of breath.  She has no bowel/bladder changes, nausea, vomiting, headaches, vision changes.  She has some hot flashes at night.  She has no lymphadenopathy, unintentional weight loss.  A detailed ROS was otherwise non contributory.    PAST MEDICAL HISTORY: Past Medical History:  Diagnosis Date  . Closed fracture of navicular bone of foot 06/17/2016  . Estrogen deficiency   . Osteoporosis     PAST SURGICAL HISTORY: Past Surgical History:  Procedure  Laterality Date  . wisdom tooth removal      FAMILY HISTORY Family History  Problem Relation Age of Onset  . Pulmonary fibrosis Mother   . Osteoarthritis Mother   . Chronic Renal Failure Mother   . Osteoporosis Mother   . Arthritis Maternal Grandmother   . COPD Maternal Grandmother   . Pulmonary fibrosis Maternal Grandmother   . Alcohol abuse Maternal Grandfather   . Early death Maternal Grandfather   . Breast cancer Neg Hx       SOCIAL HISTORY:  Married.  Lives with her husband in [place].  Very active.      ADVANCED DIRECTIVES:    HEALTH MAINTENANCE: Social History   Tobacco Use  . Smoking status: Never Smoker  . Smokeless tobacco: Never Used  Substance Use Topics  . Alcohol use: Yes    Comment: 1-2 glasses of wine nightly   . Drug use: No     Colonoscopy:  PAP:  Bone density:   Allergies  Allergen Reactions  . Demerol [Meperidine] Nausea And Vomiting  . Versed [Midazolam] Nausea And Vomiting    Current Outpatient Medications  Medication Sig Dispense Refill  . bimatoprost (LATISSE) 0.03 % ophthalmic solution APPLY TO AFFECTED AREA EVERY DAY  3  . cholecalciferol (VITAMIN D3) 25 MCG (1000 UT) tablet Take 1,000 Units by mouth daily.    . clobetasol ointment (TEMOVATE) 2.99 % Apply 1 application topically 2 (two) times daily. [place]  g 15   No current facility-administered medications for this visit.     OBJECTIVE:  Vitals:   08/18/19 1226  BP: 125/80  Pulse: 60  Resp: 18  Temp: 98 F (36.7 C)  SpO2: 98%     Body mass index is 20.03 kg/m.   Wt Readings from Last 3 Encounters:  08/18/19 117 lb 9.6 oz (53.3 kg)  06/28/19 116 lb 3.2 oz (52.7 kg)  03/31/19 114 lb (51.7 kg)      ECOG FS:0 - Asymptomatic GENERAL: Patient is a well appearing female in no acute distress HEENT:  Sclerae anicteric.  Oropharynx clear and moist. No ulcerations or evidence of oropharyngeal candidiasis. Neck is supple.  NODES:  No cervical, supraclavicular, or  axillary lymphadenopathy palpated.  LUNGS:  Clear to auscultation bilaterally.  No wheezes or rhonchi. HEART:  Regular rate and rhythm. No murmur appreciated. ABDOMEN:  Soft, nontender.  Positive, normoactive bowel sounds. No organomegaly palpated. MSK:  No focal spinal tenderness to palpation.  EXTREMITIES:  No peripheral edema.   SKIN:  Clear with no obvious rashes or skin changes. No nail dyscrasia. NEURO:  Nonfocal. Well oriented.  Appropriate affect.     LAB RESULTS:  CMP     Component Value Date/Time   NA 136 08/18/2019 1209   K 4.7 08/18/2019 1209   CL 97 (L) 08/18/2019 1209   CO2 28 08/18/2019 1209   GLUCOSE 100 (H) 08/18/2019 1209   BUN 12 08/18/2019 1209   CREATININE 0.94 08/18/2019 1209   CREATININE 0.89 10/13/2017 1610   CALCIUM 9.0 08/18/2019 1209   PROT 6.9 08/18/2019 1209   ALBUMIN 4.2 08/18/2019 1209   AST 26 08/18/2019 1209   ALT 16 08/18/2019 1209   ALKPHOS 75 08/18/2019 1209   BILITOT 0.6 08/18/2019 1209   GFRNONAA >60 08/18/2019 1209   GFRAA >60 08/18/2019 1209    No results found for: Ronnald Ramp, A1GS, A2GS, BETS, BETA2SER, GAMS, MSPIKE, SPEI  No results found for: Nils Pyle, [facility]  Lab Results  Component Value Date   WBC 4.4 08/18/2019   NEUTROABS 2.4 08/18/2019   HGB 14.0 08/18/2019   HCT 42.7 08/18/2019   MCV 93.0 08/18/2019   PLT 177 08/18/2019      Chemistry      Component Value Date/Time   NA 136 08/18/2019 1209   K 4.7 08/18/2019 1209   CL 97 (L) 08/18/2019 1209   CO2 28 08/18/2019 1209   BUN 12 08/18/2019 1209   CREATININE 0.94 08/18/2019 1209   CREATININE 0.89 10/13/2017 1610      Component Value Date/Time   CALCIUM 9.0 08/18/2019 1209   ALKPHOS 75 08/18/2019 1209   AST 26 08/18/2019 1209   ALT 16 08/18/2019 1209   BILITOT 0.6 08/18/2019 1209       No results found for: LABCA2  No components found for: SLHTDS287  No results for input(s): INR in the last 168 hours.  No results  found for: LABCA2  No results found for: GOT157  No results found for: WIO035  No results found for: DHR416  No results found for: CA2729  No components found for: HGQUANT  No results found for: CEA1 / No results found for: CEA1   No results found for: AFPTUMOR  No results found for: CHROMOGRNA  No results found for: PSA1  Appointment on 08/18/2019  Component Date Value Ref Range Status  . Sed Rate 08/18/2019 0  0 - 22 mm/hr Final   Performed at [facility]  [facility], [place] [address] [place], [place] [address]  . Smear Review 08/18/2019 SMEAR STAINED AND AVAILABLE FOR REVIEW   Final   Performed at [facility], [address] [place], [place] [address]  . Sodium 08/18/2019 136  135 - 145 mmol/L Final  . Potassium 08/18/2019 4.7  3.5 - 5.1 mmol/L Final  . Chloride 08/18/2019 97* 98 - 111 mmol/L Final  . CO2 08/18/2019 28  22 - 32 mmol/L Final  . Glucose, Bld 08/18/2019 100* 70 - 99 mg/dL Final  . BUN 08/18/2019 12  8 - 23 mg/dL Final  . Creatinine 08/18/2019 0.94  0.44 - 1.00 mg/dL Final  . Calcium 08/18/2019 9.0  8.9 - 10.3 mg/dL Final  . Total Protein 08/18/2019 6.9  6.5 - 8.1 g/dL Final  . Albumin 08/18/2019 4.2  3.5 - 5.0 g/dL Final  . AST 08/18/2019 26  15 - 41 U/L Final  . ALT 08/18/2019 16  0 - 44 U/L Final  . Alkaline Phosphatase 08/18/2019 75  38 - 126 U/L Final  . Total Bilirubin 08/18/2019 0.6  0.3 - 1.2 mg/dL Final  . GFR, Est Non Af Am 08/18/2019 >60  >60 mL/min Final  . GFR, Est AFR Am 08/18/2019 >60  >60 mL/min Final  . Anion gap 08/18/2019 11  5 - 15 Final   Performed at [facility], [place] [address], [place], [place] [address]  . WBC Count 08/18/2019 4.4  4.0 - 10.5 K/uL Final  . RBC 08/18/2019 4.59  3.87 - 5.11 MIL/uL Final  . Hemoglobin 08/18/2019 14.0  12.0 - 15.0 g/dL Final  . HCT 08/18/2019 42.7  36.0 - 46.0 % Final  . MCV 08/18/2019 93.0  80.0 - 100.0 fL Final  . MCH 08/18/2019 30.5   26.0 - 34.0 pg Final  . MCHC 08/18/2019 32.8  30.0 - 36.0 g/dL Final  . RDW 08/18/2019 11.9  11.5 - 15.5 % Final  . Platelet Count 08/18/2019 177  150 - 400 K/uL Final  . nRBC 08/18/2019 0.0  0.0 - 0.2 % Final  . Neutrophils Relative % 08/18/2019 54  % Final  . Neutro Abs 08/18/2019 2.4  1.7 - 7.7 K/uL Final  . Lymphocytes Relative 08/18/2019 36  % Final  . Lymphs Abs 08/18/2019 1.6  0.7 - 4.0 K/uL Final  . Monocytes Relative 08/18/2019 7  % Final  . Monocytes Absolute 08/18/2019 0.3  0.1 - 1.0 K/uL Final  . Eosinophils Relative 08/18/2019 1  % Final  . Eosinophils Absolute 08/18/2019 0.1  0.0 - 0.5 K/uL Final  . Basophils Relative 08/18/2019 1  % Final  . Basophils Absolute 08/18/2019 0.0  0.0 - 0.1 K/uL Final  . Immature Granulocytes 08/18/2019 1  % Final  . Abs Immature Granulocytes 08/18/2019 0.02  0.00 - 0.07 K/uL Final   Performed at [facility], [place] [address] [place], [place] [address]    (this displays the last labs from the last 3 days)  No results found for: TOTALPROTELP, ALBUMINELP, A1GS, A2GS, BETS, BETA2SER, GAMS, MSPIKE, SPEI (this displays SPEP labs)  No results found for: KPAFRELGTCHN, LAMBDASER, KAPLAMBRATIO (kappa/lambda light chains)  No results found for: HGBA, HGBA2QUANT, HGBFQUANT, HGBSQUAN (Hemoglobinopathy evaluation)   No results found for: LDH  No results found for: IRON, TIBC, IRONPCTSAT (Iron and TIBC)  No results found for: FERRITIN  Urinalysis    Component Value Date/Time   LABSPEC 1.010 10/13/2017 1629   BILIRUBINUR negative 10/13/2017  [place] negative 10/13/2017 1629   PROTEINUR negative 10/13/2017 1629   NITRITE Negative 10/13/2017 1629   LEUKOCYTESUR Negative 10/13/2017 1629     STUDIES: No results found.    ASSESSMENT: 63 y.o. woman in good health here for evaluation of persistent leukopenia.    1.  WBC/ANC  3.5/1.8 on 06/28/2019 and 2.8/1.2 on 08/06/2019 and was referred here.  CBC on 9/30  shows recovered WBC/ANC of 4.4/2.4.  2. B12 deficiency noted on 06/28/2019 at 211.  Received weekly b12 injections and on 9/18 her b12 level was 500.  (a) intrinsic factor antibody and anti parietal antibody tested on 08/18/2019  PLAN:  Deborah Wright is doing well today.  I reviewed her CBC with her and her WBC has rebounded.  She met with Dr. Jana Hakim who noted that her leukopenia and neutropenia was likely transient and could have been due to a number of different causes.  We are happy that her labs have recovered.  Due to her b12 deficiency we are testing intrinsic factor antibody and anti parietal antibodies to evaluate whether or not she can absorb oral b12. If not, she will require monthly b12 injections.    Since Deborah Wright's PCP is in transition, we will have her return in 3 months and in 6 months.  So long as her CBC remains stable, she can be followed up with annual labs with her PCP after that point.    We can see her at any point in the future on an as needed basis.  She was recommended to continue with the appropriate pandemic precautions. She knows to call for any questions that may arise.  Deborah Bihari, NP   08/18/2019 7:46 PM Medical Oncology and Hematology [facility] [address] [place], [place] [address] Tel. [phone]    Fax. [phone]   ADDENDUM: Ms. Faughnan presented with fatigue and work-up on 06/28/2019 by her primary care physician showed a B12 level in the lower range of normal at 223.  She was started on B12 supplementation and has felt considerably better since that time.  A methylmalonic acid level was not obtained.  Also she was noted to have a decrease in her white cell count at 3.5, which was repeated a month later and was lower at 2.8 with an absolute neutral count of 1.2.  At that point the hemoglobin was 4.5 and the platelet count 189,000.  At the 08/18/2019 visit counts were entirely normal with a white cells being 4.4, neutrophils 2.4,  hemoglobin 14.0, MCV 93, and platelets 177,000.  At that visit we also obtained antiintrinsic factor and antiparietal cell antibodies both which were negative.  At the 08/18/2019 visit we discussed the physiology of B12 absorption.  The patient understands intrinsic factor protects B12 through the acidic stomach environment, and then in the proximal small bowel intrinsic factor is dissolved by pancreatic enzymes releasing B12 for absorption.  In the presence of pancreatitis, or similar processes that can interfere with pancreatic enzyme production B12 deficiency can result just as it could from antibodies against parietal cells or intrinsic factor itself.  Other causes of B12 deficiency can include atrophy of the gastric mucosa and some medications including omeprazole and cimetidine.  At this point the patient is counts appear to have normalized.  I do not have a simple explanation for her earlier leukopenia.  Possibly she had an intercurrent viral infection which is known to be a cause of a transient drop in the white cell count.  We are going to check Ebelin's counts in 3 and 6 months just to make sure there are no other issues of concern.  Otherwise we recommend continuing the B12 supplementation and if monthly injections become difficult for her switching to oral supplementation long-term in the absence of any documented antibodies may be adequate.  We will be glad to see Relena again at any point in the future if and when the need arises but as of now are not scheduling any further routine visits for her here.  I personally saw this patient and performed a substantive portion of this encounter with the listed APP documented above.   Chauncey Cruel, MD Medical Oncology and Hematology [facility] [address] [place], [place] [address] Tel. [phone]    Fax. [phone]

## 2019-08-19 ENCOUNTER — Telehealth: Payer: Self-pay | Admitting: Adult Health

## 2019-08-19 LAB — INTRINSIC FACTOR ANTIBODIES: Intrinsic Factor: 1 AU/mL (ref 0.0–1.1)

## 2019-08-19 NOTE — Telephone Encounter (Signed)
I talk with patient regarding schedule  

## 2019-08-20 ENCOUNTER — Telehealth: Payer: Self-pay

## 2019-08-20 LAB — ANTI-PARIETAL ANTIBODY: Parietal Cell Antibody-IgG: 0.9 Units (ref 0.0–20.0)

## 2019-08-20 NOTE — Telephone Encounter (Signed)
TC to pt per Mendel Deborah Wright to let her know that her antibodies we tested show that she should be absorbing b12 well. Patient verbalized understanding. No further problems or concerns at this time.

## 2019-08-26 ENCOUNTER — Other Ambulatory Visit: Payer: Self-pay | Admitting: Oncology

## 2019-09-13 ENCOUNTER — Telehealth: Payer: Self-pay | Admitting: Family Medicine

## 2019-09-13 NOTE — Telephone Encounter (Signed)
Patient got her flu shot on 08/31/2019 at [facility] at:   [address] [place] [address]  Please update chart. Patient has transfer of care for next year.

## 2019-09-14 NOTE — Telephone Encounter (Signed)
Chart has been updated.

## 2019-11-15 ENCOUNTER — Other Ambulatory Visit: Payer: Self-pay

## 2019-11-15 ENCOUNTER — Ambulatory Visit
Admission: RE | Admit: 2019-11-15 | Discharge: 2019-11-15 | Disposition: A | Discharge status: Home / Self Care | Payer: 59 | Source: Ambulatory Visit | Attending: Family Medicine | Admitting: Family Medicine

## 2019-11-15 DIAGNOSIS — M816 Localized osteoporosis [Lequesne]: Secondary | ICD-10-CM

## 2019-11-17 ENCOUNTER — Inpatient Hospital Stay: Payer: 59 | Attending: Adult Health

## 2019-11-17 ENCOUNTER — Other Ambulatory Visit: Payer: Self-pay | Admitting: *Deleted

## 2019-11-17 ENCOUNTER — Other Ambulatory Visit: Payer: Self-pay

## 2019-11-17 DIAGNOSIS — D72819 Decreased white blood cell count, unspecified: Secondary | ICD-10-CM | POA: Insufficient documentation

## 2019-11-17 LAB — CBC WITH DIFFERENTIAL (CANCER CENTER ONLY)
Abs Immature Granulocytes: 0.01 10*3/uL (ref 0.00–0.07)
Basophils Absolute: 0 10*3/uL (ref 0.0–0.1)
Basophils Relative: 1 %
Eosinophils Absolute: 0.2 10*3/uL (ref 0.0–0.5)
Eosinophils Relative: 4 %
HCT: 44.8 % (ref 36.0–46.0)
Hemoglobin: 14.9 g/dL (ref 12.0–15.0)
Immature Granulocytes: 0 %
Lymphocytes Relative: 39 %
Lymphs Abs: 1.5 10*3/uL (ref 0.7–4.0)
MCH: 30.6 pg (ref 26.0–34.0)
MCHC: 33.3 g/dL (ref 30.0–36.0)
MCV: 92 fL (ref 80.0–100.0)
Monocytes Absolute: 0.3 10*3/uL (ref 0.1–1.0)
Monocytes Relative: 8 %
Neutro Abs: 1.9 10*3/uL (ref 1.7–7.7)
Neutrophils Relative %: 48 %
Platelet Count: 171 10*3/uL (ref 150–400)
RBC: 4.87 MIL/uL (ref 3.87–5.11)
RDW: 12 % (ref 11.5–15.5)
WBC Count: 3.9 10*3/uL — ABNORMAL LOW (ref 4.0–10.5)
nRBC: 0 % (ref 0.0–0.2)

## 2019-11-17 LAB — CMP (CANCER CENTER ONLY)
ALT: 17 U/L (ref 0–44)
AST: 24 U/L (ref 15–41)
Albumin: 4.2 g/dL (ref 3.5–5.0)
Alkaline Phosphatase: 73 U/L (ref 38–126)
Anion gap: 9 (ref 5–15)
BUN: 11 mg/dL (ref 8–23)
CO2: 30 mmol/L (ref 22–32)
Calcium: 9.3 mg/dL (ref 8.9–10.3)
Chloride: 99 mmol/L (ref 98–111)
Creatinine: 0.98 mg/dL (ref 0.44–1.00)
GFR, Est AFR Am: >60 mL/min (ref >60)
GFR, Estimated: >60 mL/min (ref >60)
Glucose, Bld: 92 mg/dL (ref 70–99)
Potassium: 4.9 mmol/L (ref 3.5–5.1)
Sodium: 138 mmol/L (ref 135–145)
Total Bilirubin: 0.6 mg/dL (ref 0.3–1.2)
Total Protein: 7.3 g/dL (ref 6.5–8.1)

## 2019-11-24 ENCOUNTER — Encounter (INDEPENDENT_AMBULATORY_CARE_PROVIDER_SITE_OTHER): Payer: Self-pay | Admitting: Family Medicine

## 2019-11-24 NOTE — Telephone Encounter (Signed)
Please review

## 2020-02-07 ENCOUNTER — Other Ambulatory Visit: Payer: Self-pay | Admitting: Family Medicine

## 2020-02-07 DIAGNOSIS — Z1231 Encounter for screening mammogram for malignant neoplasm of breast: Secondary | ICD-10-CM

## 2020-02-11 ENCOUNTER — Other Ambulatory Visit: Payer: Self-pay | Admitting: *Deleted

## 2020-02-14 ENCOUNTER — Other Ambulatory Visit: Payer: Self-pay | Admitting: *Deleted

## 2020-02-14 ENCOUNTER — Inpatient Hospital Stay: Payer: 59 | Attending: Adult Health

## 2020-02-14 ENCOUNTER — Other Ambulatory Visit: Payer: Self-pay

## 2020-02-14 DIAGNOSIS — D72819 Decreased white blood cell count, unspecified: Secondary | ICD-10-CM

## 2020-02-14 LAB — CBC WITH DIFFERENTIAL (CANCER CENTER ONLY)
Abs Immature Granulocytes: 0 10*3/uL (ref 0.00–0.07)
Basophils Absolute: 0 10*3/uL (ref 0.0–0.1)
Basophils Relative: 1 %
Eosinophils Absolute: 0.1 10*3/uL (ref 0.0–0.5)
Eosinophils Relative: 4 %
HCT: 42.7 % (ref 36.0–46.0)
Hemoglobin: 14.1 g/dL (ref 12.0–15.0)
Immature Granulocytes: 0 %
Lymphocytes Relative: 48 %
Lymphs Abs: 1.7 10*3/uL (ref 0.7–4.0)
MCH: 30.9 pg (ref 26.0–34.0)
MCHC: 33 g/dL (ref 30.0–36.0)
MCV: 93.4 fL (ref 80.0–100.0)
Monocytes Absolute: 0.3 10*3/uL (ref 0.1–1.0)
Monocytes Relative: 8 %
Neutro Abs: 1.3 10*3/uL — ABNORMAL LOW (ref 1.7–7.7)
Neutrophils Relative %: 39 %
Platelet Count: 148 10*3/uL — ABNORMAL LOW (ref 150–400)
RBC: 4.57 MIL/uL (ref 3.87–5.11)
RDW: 12.3 % (ref 11.5–15.5)
WBC Count: 3.5 10*3/uL — ABNORMAL LOW (ref 4.0–10.5)
nRBC: 0 % (ref 0.0–0.2)

## 2020-02-14 LAB — CMP (CANCER CENTER ONLY)
ALT: 17 U/L (ref 0–44)
AST: 25 U/L (ref 15–41)
Albumin: 3.9 g/dL (ref 3.5–5.0)
Alkaline Phosphatase: 61 U/L (ref 38–126)
Anion gap: 8 (ref 5–15)
BUN: 16 mg/dL (ref 8–23)
CO2: 29 mmol/L (ref 22–32)
Calcium: 9.1 mg/dL (ref 8.9–10.3)
Chloride: 103 mmol/L (ref 98–111)
Creatinine: 0.93 mg/dL (ref 0.44–1.00)
GFR, Est AFR Am: >60 mL/min (ref >60)
GFR, Estimated: >60 mL/min (ref >60)
Glucose, Bld: 76 mg/dL (ref 70–99)
Potassium: 4.9 mmol/L (ref 3.5–5.1)
Sodium: 140 mmol/L (ref 135–145)
Total Bilirubin: 0.6 mg/dL (ref 0.3–1.2)
Total Protein: 6.8 g/dL (ref 6.5–8.1)

## 2020-03-03 ENCOUNTER — Other Ambulatory Visit: Payer: Self-pay

## 2020-03-03 ENCOUNTER — Ambulatory Visit
Admission: RE | Admit: 2020-03-03 | Discharge: 2020-03-03 | Disposition: A | Discharge status: Home / Self Care | Payer: 59 | Source: Ambulatory Visit

## 2020-03-03 DIAGNOSIS — Z1231 Encounter for screening mammogram for malignant neoplasm of breast: Secondary | ICD-10-CM

## 2020-06-28 ENCOUNTER — Encounter: Payer: 59 | Admitting: Family Medicine

## 2020-06-30 ENCOUNTER — Encounter: Payer: 59 | Admitting: Family Medicine

## 2020-07-14 ENCOUNTER — Encounter: Payer: 59 | Admitting: Family Medicine

## 2020-07-31 ENCOUNTER — Ambulatory Visit (INDEPENDENT_AMBULATORY_CARE_PROVIDER_SITE_OTHER): Payer: 59 | Admitting: Family Medicine

## 2020-07-31 ENCOUNTER — Other Ambulatory Visit: Payer: Self-pay

## 2020-07-31 ENCOUNTER — Encounter: Payer: Self-pay | Admitting: Family Medicine

## 2020-07-31 VITALS — BP 109/74 | HR 74 | Temp 97.9°F | Ht 64.25 in | Wt 115.4 lb

## 2020-07-31 DIAGNOSIS — M816 Localized osteoporosis [Lequesne]: Secondary | ICD-10-CM

## 2020-07-31 DIAGNOSIS — E538 Deficiency of other specified B group vitamins: Secondary | ICD-10-CM

## 2020-07-31 DIAGNOSIS — Z0000 Encounter for general adult medical examination without abnormal findings: Secondary | ICD-10-CM

## 2020-07-31 DIAGNOSIS — Z1211 Encounter for screening for malignant neoplasm of colon: Secondary | ICD-10-CM | POA: Diagnosis not present

## 2020-07-31 DIAGNOSIS — Q677 Pectus carinatum: Secondary | ICD-10-CM | POA: Insufficient documentation

## 2020-07-31 NOTE — Progress Notes (Signed)
Patient: Deborah Wright MRN: [id number] DOB: Nov 25, 1955 PCP: No primary care provider on file.     Subjective:  Chief Complaint  Patient presents with  . Annual Exam  . Osteoporosis  . b12 deficiency    HPI: The patient is a 64 y.o. female who presents today for annual exam. She denies any changes to past medical history. There have been no recent hospitalizations. They are following a well balanced diet and exercise plan. She says that she works out daily.  Weight has been stable. No complaints today.   Osteoporosis -she is very active and does weight bearing activities and weight training. She is on no medication except over the counter medication.   b12 deficiency Has had work up. Last checked 12 months ago and wnl. No fatigue.   Immunization History  Administered Date(s) Administered  . Influenza-Unspecified 08/27/2017, 08/31/2019  . Moderna SARS-COVID-2 Vaccination 12/21/2019, 01/18/2020  . Tdap 11/18/2010   Colonoscopy: had at age 22 years. Normal. Repeat 10 years.  Mammogram: 02/2020 Pap smear: 2017. Due for this.    Review of Systems  Constitutional: Negative for chills, fatigue and fever.  HENT: Negative for dental problem, ear pain, hearing loss and trouble swallowing.   Eyes: Negative for visual disturbance.  Respiratory: Negative for cough, chest tightness and shortness of breath.   Cardiovascular: Negative for chest pain, palpitations and leg swelling.  Gastrointestinal: Negative for abdominal pain, blood in stool, diarrhea and nausea.  Endocrine: Negative for cold intolerance, polydipsia, polyphagia and polyuria.  Genitourinary: Negative for dysuria and hematuria.  Musculoskeletal: Negative for arthralgias.  Skin: Negative for rash.  Neurological: Negative for dizziness and headaches.  Psychiatric/Behavioral: Negative for dysphoric mood and sleep disturbance. The patient is not nervous/anxious.     Allergies Patient is allergic to demerol [meperidine] and  versed [midazolam].  Past Medical History Patient  has a past medical history of Closed fracture of navicular bone of foot (06/17/2016), Estrogen deficiency, and Osteoporosis.  Surgical History Patient  has a past surgical history that includes wisdom tooth removal.  Family History Pateint's family history includes Alcohol abuse in her maternal grandfather; Arthritis in her maternal grandmother; COPD in her maternal grandmother; Chronic Renal Failure in her mother; Early death in her maternal grandfather; Osteoarthritis in her mother; Osteoporosis in her mother; Pulmonary fibrosis in her maternal grandmother and mother.  Social History Patient  reports that she has never smoked. She has never used smokeless tobacco. She reports current alcohol use. She reports that she does not use drugs.    Objective: Vitals:   07/31/20 1335  BP: 109/74  Pulse: 74  Temp: 97.9 F (36.6 C)  TempSrc: Temporal  SpO2: 100%  Weight: 115 lb 6.4 oz (52.3 kg)  Height: 5' 4.25" (1.632 m)    Body mass index is 19.65 kg/m.  Physical Exam Vitals reviewed.  Constitutional:      Appearance: Normal appearance. She is well-developed and normal weight.  HENT:     Head: Normocephalic and atraumatic.     Right Ear: Tympanic membrane, ear canal and external ear normal.     Left Ear: Tympanic membrane, ear canal and external ear normal.     Nose: Nose normal.     Mouth/Throat:     Mouth: Mucous membranes are moist.  Eyes:     Extraocular Movements: Extraocular movements intact.     Conjunctiva/sclera: Conjunctivae normal.     Pupils: Pupils are equal, round, and reactive to light.  Neck:     Thyroid: No  thyromegaly.  Cardiovascular:     Rate and Rhythm: Normal rate and regular rhythm.     Heart sounds: Normal heart sounds. No murmur heard.   Pulmonary:     Effort: Pulmonary effort is normal.     Breath sounds: Normal breath sounds.  Abdominal:     General: Abdomen is flat. Bowel sounds are normal.  There is no distension.     Palpations: Abdomen is soft.     Tenderness: There is no abdominal tenderness.  Musculoskeletal:     Cervical back: Normal range of motion and neck supple.     Comments: Pectus carinatum   Lymphadenopathy:     Cervical: No cervical adenopathy.  Skin:    General: Skin is warm and dry.     Capillary Refill: Capillary refill takes less than 2 seconds.     Findings: No rash.  Neurological:     General: No focal deficit present.     Mental Status: She is alert and oriented to person, place, and time.     Cranial Nerves: No cranial nerve deficit.     Coordination: Coordination normal.     Deep Tendon Reflexes: Reflexes normal.  Psychiatric:        Mood and Affect: Mood normal.        Behavior: Behavior normal.      Office Visit from 07/31/2020 in [place]  PHQ-2 Total Score 0         Assessment/plan: 1. Annual physical exam Routine fasting labs today. HM reviewed. cologuard ordered for colon cancer screening and she will return for pap smear. Flu shot in October, otherwise UTD. Very healthy and fit. Continue active and healthy lifestyle. Fu in one year or as needed.  Patient counseling [x]    Nutrition: Stressed importance of moderation in sodium/caffeine intake, saturated fat and cholesterol, caloric balance, sufficient intake of fresh fruits, vegetables, fiber, calcium, iron, and 1 mg of folate supplement per day (for females capable of pregnancy).  [x]    Stressed the importance of regular exercise.   []    Substance Abuse: Discussed cessation/primary prevention of tobacco, alcohol, or other drug use; driving or other dangerous activities under the influence; availability of treatment for abuse.   [x]    Injury prevention: Discussed safety belts, safety helmets, smoke detector, smoking near bedding or upholstery.   [x]    Sexuality: Discussed sexually transmitted diseases, partner selection, use of condoms, avoidance of unintended  pregnancy  and contraceptive alternatives.  [x]    Dental health: Discussed importance of regular tooth brushing, flossing, and dental visits.  [x]    Health maintenance and immunizations reviewed. Please refer to Health maintenance section.    - CBC with Differential/Platelet; Future - TSH; Future - Lipid panel; Future - COMPLETE METABOLIC PANEL WITH GFR; Future - COMPLETE METABOLIC PANEL WITH GFR - Lipid panel - TSH - CBC with Differential/Platelet  2. Localized osteoporosis without current pathological fracture Discussed calcium and wrote down dosage. On vitamin D. Does not repeat dexa for 1-2 years. Already doing weight bearing activities. Discussed prolia vs. Fosamax as well, but she would like to get her bone scan back before any treatment.  - VITAMIN D 25 Hydroxy (Vit-D Deficiency, Fractures); Future - VITAMIN D 25 Hydroxy (Vit-D Deficiency, Fractures)  3. B12 deficiency  - Vitamin B12; Future - Vitamin B12  4. Colon cancer screening  - Cologuard; Future   This visit occurred during the SARS-CoV-2 public health emergency.  Safety protocols were in place, including screening questions prior  to the visit, additional usage of staff PPE, and extensive cleaning of exam room while observing appropriate contact time as indicated for disinfecting solutions.     Return in about 3 months (around 10/30/2020) for pap smear! Orma Flaming, MD [place]  07/31/2020

## 2020-07-31 NOTE — Patient Instructions (Addendum)
. Recommend 1269m calcium and 800-1000IU/vitamin D daily.  Drugs for osteoporosis are 1) fosamax. Oral that you take once/week for a total of 5 years.  2) prolia is an injection that you do every 6 months. Monoclonal antibody. I prefer this.   -cologaurd they will mail to you. Good for 3 years.   -need pap smear, come back for this later on.    Preventive Care [age]Years Old, Female Preventive care refers to visits with your health care provider and lifestyle choices that can promote health and wellness. This includes:  A yearly physical exam. This may also be called an annual well check.  Regular dental visits and eye exams.  Immunizations.  Screening for certain conditions.  Healthy lifestyle choices, such as eating a healthy diet, getting regular exercise, not using drugs or products that contain nicotine and tobacco, and limiting alcohol use. What can I expect for my preventive care visit? Physical exam Your health care provider will check your:  Height and weight. This may be used to calculate body mass index (BMI), which tells if you are at a healthy weight.  Heart rate and blood pressure.  Skin for abnormal spots. Counseling Your health care provider may ask you questions about your:  Alcohol, tobacco, and drug use.  Emotional well-being.  Home and relationship well-being.  Sexual activity.  Eating habits.  Work and work eStatistician  Method of birth control.  Menstrual cycle.  Pregnancy history. What immunizations do I need?  Influenza (flu) vaccine  This is recommended every year. Tetanus, diphtheria, and pertussis (Tdap) vaccine  You may need a Td booster every 10 years. Varicella (chickenpox) vaccine  You may need this if you have not been vaccinated. Zoster (shingles) vaccine  You may need this after age 58276 Measles, mumps, and rubella (MMR) vaccine  You may need at least one dose of MMR if you were born in 1957 or later. You may also  need a second dose. Pneumococcal conjugate (PCV13) vaccine  You may need this if you have certain conditions and were not previously vaccinated. Pneumococcal polysaccharide (PPSV23) vaccine  You may need one or two doses if you smoke cigarettes or if you have certain conditions. Meningococcal conjugate (MenACWY) vaccine  You may need this if you have certain conditions. Hepatitis A vaccine  You may need this if you have certain conditions or if you travel or work in places where you may be exposed to hepatitis A. Hepatitis B vaccine  You may need this if you have certain conditions or if you travel or work in places where you may be exposed to hepatitis B. Haemophilus influenzae type b (Hib) vaccine  You may need this if you have certain conditions. Human papillomavirus (HPV) vaccine  If recommended by your health care provider, you may need three doses over 6 months. You may receive vaccines as individual doses or as more than one vaccine together in one shot (combination vaccines). Talk with your health care provider about the risks and benefits of combination vaccines. What tests do I need? Blood tests  Lipid and cholesterol levels. These may be checked every 5 years, or more frequently if you are over 520years old.  Hepatitis C test.  Hepatitis B test. Screening  Lung cancer screening. You may have this screening every year starting at age 6202if you have a 62-pack-year history of smoking and currently smoke or have quit within the past 15 years.  Colorectal cancer screening. All adults should have  this screening starting at age 16 and continuing until age 21. Your health care provider may recommend screening at age 28 if you are at increased risk. You will have tests every 1-10 years, depending on your results and the type of screening test.  Diabetes screening. This is done by checking your blood sugar (glucose) after you have not eaten for a while (fasting). You may have  this done every 1-3 years.  Mammogram. This may be done every 1-2 years. Talk with your health care provider about when you should start having regular mammograms. This may depend on whether you have a family history of breast cancer.  BRCA-related cancer screening. This may be done if you have a family history of breast, ovarian, tubal, or peritoneal cancers.  Pelvic exam and Pap test. This may be done every 3 years starting at age 63. Starting at age 63, this may be done every 5 years if you have a Pap test in combination with an HPV test. Other tests  Sexually transmitted disease (STD) testing.  Bone density scan. This is done to screen for osteoporosis. You may have this scan if you are at high risk for osteoporosis. Follow these instructions at home: Eating and drinking  Eat a diet that includes fresh fruits and vegetables, whole grains, lean protein, and low-fat dairy.  Take vitamin and mineral supplements as recommended by your health care provider.  Do not drink alcohol if: ? Your health care provider tells you not to drink. ? You are pregnant, may be pregnant, or are planning to become pregnant.  If you drink alcohol: ? Limit how much you have to 0-1 drink a day. ? Be aware of how much alcohol is in your drink. In the [place] one drink equals one 12 oz bottle of beer (355 mL), one 5 oz glass of wine (148 mL), or one 1 oz glass of hard liquor (44 mL). Lifestyle  Take daily care of your teeth and gums.  Stay active. Exercise for at least 30 minutes on 5 or more days each week.  Do not use any products that contain nicotine or tobacco, such as cigarettes, e-cigarettes, and chewing tobacco. If you need help quitting, ask your health care provider.  If you are sexually active, practice safe sex. Use a condom or other form of birth control (contraception) in order to prevent pregnancy and STIs (sexually transmitted infections).  If told by your health care provider, take  low-dose aspirin daily starting at age 62. What's next?  Visit your health care provider once a year for a well check visit.  Ask your health care provider how often you should have your eyes and teeth checked.  Stay up to date on all vaccines. This information is not intended to replace advice given to you by your health care provider. Make sure you discuss any questions you have with your health care provider. Document Revised: 07/16/2018 Document Reviewed: 07/16/2018 Elsevier Patient Education  2020 [employer].

## 2020-08-01 LAB — COMPLETE METABOLIC PANEL WITH GFR
AG Ratio: 1.8 (calc) (ref 1.0–2.5)
ALT: 16 U/L (ref 6–29)
AST: 24 U/L (ref 10–35)
Albumin: 4.2 g/dL (ref 3.6–5.1)
Alkaline phosphatase (APISO): 56 U/L (ref 37–153)
BUN: 15 mg/dL (ref 7–25)
CO2: 33 mmol/L — ABNORMAL HIGH (ref 20–32)
Calcium: 9.3 mg/dL (ref 8.6–10.4)
Chloride: 99 mmol/L (ref 98–110)
Creat: 0.94 mg/dL (ref 0.50–0.99)
GFR, Est African American: 75 mL/min/{1.73_m2} (ref >=60)
GFR, Est Non African American: 65 mL/min/{1.73_m2} (ref >=60)
Globulin: 2.3 g/dL (calc) (ref 1.9–3.7)
Glucose, Bld: 88 mg/dL (ref 65–99)
Potassium: 4.3 mmol/L (ref 3.5–5.3)
Sodium: 137 mmol/L (ref 135–146)
Total Bilirubin: 0.7 mg/dL (ref 0.2–1.2)
Total Protein: 6.5 g/dL (ref 6.1–8.1)

## 2020-08-01 LAB — CBC WITH DIFFERENTIAL/PLATELET
Absolute Monocytes: 311 cells/uL (ref 200–950)
Basophils Absolute: 32 cells/uL (ref 0–200)
Basophils Relative: 0.7 %
Eosinophils Absolute: 99 cells/uL (ref 15–500)
Eosinophils Relative: 2.2 %
HCT: 42.2 % (ref 35.0–45.0)
Hemoglobin: 14.1 g/dL (ref 11.7–15.5)
Lymphs Abs: 1580 cells/uL (ref 850–3900)
MCH: 30.9 pg (ref 27.0–33.0)
MCHC: 33.4 g/dL (ref 32.0–36.0)
MCV: 92.3 fL (ref 80.0–100.0)
MPV: 10 fL (ref 7.5–12.5)
Monocytes Relative: 6.9 %
Neutro Abs: 2480 cells/uL (ref 1500–7800)
Neutrophils Relative %: 55.1 %
Platelets: 192 10*3/uL (ref 140–400)
RBC: 4.57 10*6/uL (ref 3.80–5.10)
RDW: 11.9 % (ref 11.0–15.0)
Total Lymphocyte: 35.1 %
WBC: 4.5 10*3/uL (ref 3.8–10.8)

## 2020-08-01 LAB — TSH: TSH: 1.37 mIU/L (ref 0.40–4.50)

## 2020-08-01 LAB — LIPID PANEL
Cholesterol: 249 mg/dL — ABNORMAL HIGH (ref <200)
HDL: 92 mg/dL (ref >=50)
LDL Cholesterol (Calc): 143 mg/dL (calc) — ABNORMAL HIGH
Non-HDL Cholesterol (Calc): 157 mg/dL (calc) — ABNORMAL HIGH (ref <130)
Total CHOL/HDL Ratio: 2.7 (calc) (ref <5.0)
Triglycerides: 52 mg/dL (ref <150)

## 2020-08-01 LAB — VITAMIN B12: Vitamin B-12: 614 pg/mL (ref 200–1100)

## 2020-08-01 LAB — VITAMIN D 25 HYDROXY (VIT D DEFICIENCY, FRACTURES): Vit D, 25-Hydroxy: 84 ng/mL (ref 30–100)

## 2020-08-18 ENCOUNTER — Ambulatory Visit: Payer: 59 | Admitting: Family Medicine

## 2021-01-14 ENCOUNTER — Encounter: Payer: Self-pay | Admitting: Family Medicine

## 2021-01-15 ENCOUNTER — Other Ambulatory Visit: Payer: Self-pay

## 2021-01-15 DIAGNOSIS — M816 Localized osteoporosis [Lequesne]: Secondary | ICD-10-CM

## 2021-01-15 DIAGNOSIS — Z1231 Encounter for screening mammogram for malignant neoplasm of breast: Secondary | ICD-10-CM

## 2021-01-15 DIAGNOSIS — Z1211 Encounter for screening for malignant neoplasm of colon: Secondary | ICD-10-CM

## 2021-01-25 ENCOUNTER — Ambulatory Visit (INDEPENDENT_AMBULATORY_CARE_PROVIDER_SITE_OTHER): Payer: 59 | Admitting: Family Medicine

## 2021-01-25 ENCOUNTER — Other Ambulatory Visit: Payer: Self-pay

## 2021-01-25 ENCOUNTER — Encounter: Payer: Self-pay | Admitting: Family Medicine

## 2021-01-25 VITALS — BP 140/90 | HR 76 | Temp 98.2°F | Ht 64.25 in | Wt 119.4 lb

## 2021-01-25 DIAGNOSIS — N3001 Acute cystitis with hematuria: Secondary | ICD-10-CM | POA: Diagnosis not present

## 2021-01-25 LAB — POCT URINALYSIS DIPSTICK
Bilirubin, UA: NEGATIVE
Glucose, UA: NEGATIVE
Ketones, UA: NEGATIVE
Nitrite, UA: NEGATIVE
Protein, UA: POSITIVE — AB
Spec Grav, UA: 1.015 (ref 1.010–1.025)
Urobilinogen, UA: 0.2 E.U./dL
pH, UA: 7 (ref 5.0–8.0)

## 2021-01-25 MED ORDER — NITROFURANTOIN MONOHYD MACRO 100 MG PO CAPS: 100.0000 mg | ORAL_CAPSULE | Freq: Two times a day (BID) | ORAL | 0 refills | Status: DC

## 2021-01-25 NOTE — Progress Notes (Signed)
Patient: Deborah Wright MRN: [id number] DOB: [birth year] PCP: Orma Flaming, MD     Subjective:  Chief Complaint  Patient presents with  . Hematuria  . Urinary urgency  . Urinary Frequency    Starting 1 week ago.    HPI: The patient is a 65 y.o. female who presents today for hematuria. She says this started about a 1 week ago. She also complains of frequency, with pressure when urinating. Started out as having urinary frequency and has progressed. She feels like she can't get all of her urine out and it feels heavy. No dysuria. She has no hematuria, but has had some blood when she wiped. She has had no fever/chills. She has some back pain that is new. No nausea/vomiting.   Review of Systems  Gastrointestinal: Negative for abdominal pain and nausea.  Genitourinary: Positive for frequency, hematuria and urgency.    Allergies Patient is allergic to demerol [meperidine] and versed [midazolam].  Past Medical History Patient  has a past medical history of Closed fracture of navicular bone of foot (06/17/2016), Estrogen deficiency, and Osteoporosis.  Surgical History Patient  has a past surgical history that includes wisdom tooth removal.  Family History Pateint's family history includes Alcohol abuse in her maternal grandfather; Arthritis in her maternal grandmother; COPD in her maternal grandmother; Chronic Renal Failure in her mother; Early death in her maternal grandfather; Osteoarthritis in her mother; Osteoporosis in her mother; Pulmonary fibrosis in her maternal grandmother and mother.  Social History Patient  reports that she has never smoked. She has never used smokeless tobacco. She reports current alcohol use. She reports that she does not use drugs.    Objective: Vitals:   01/25/21 1431  BP: 140/90  Pulse: 76  Temp: 98.2 F (36.8 C)  TempSrc: Temporal  SpO2: 97%  Weight: 119 lb 6.4 oz (54.2 kg)  Height: 5' 4.25" (1.632 m)    Body mass index is 20.34  kg/m.  Physical Exam Vitals reviewed.  Constitutional:      Appearance: Normal appearance. She is normal weight.  HENT:     Head: Normocephalic and atraumatic.  Cardiovascular:     Rate and Rhythm: Normal rate and regular rhythm.     Heart sounds: Normal heart sounds.  Pulmonary:     Effort: Pulmonary effort is normal.     Breath sounds: Normal breath sounds.  Abdominal:     General: Abdomen is flat. Bowel sounds are normal.     Palpations: Abdomen is soft.     Tenderness: There is no abdominal tenderness. There is no right CVA tenderness, left CVA tenderness (mild, but could be left SI joint as well ) or guarding.  Skin:    Capillary Refill: Capillary refill takes less than 2 seconds.  Neurological:     General: No focal deficit present.     Mental Status: She is alert and oriented to person, place, and time.  Psychiatric:        Mood and Affect: Mood normal.        Behavior: Behavior normal.        Assessment/plan: 1. Acute cystitis with hematuria UTI. Treating with macrobid. Not in any pain declines pyridium. Push fluids and precautions given. Discussed if any vaginal bleeding or spotting we need to do work up to rule out endometrial cancer, but blood likely from UTI. She will watch closely. See her back for pap smear and can f/u on this.  - POCT Urinalysis Dipstick - Urine Culture; Future - Urine  Culture  This visit occurred during the SARS-CoV-2 public health emergency.  Safety protocols were in place, including screening questions prior to the visit, additional usage of staff PPE, and extensive cleaning of exam room while observing appropriate contact time as indicated for disinfecting solutions.      Return if symptoms worsen or fail to improve.    Orma Flaming, MD [place]   01/25/2021

## 2021-01-25 NOTE — Telephone Encounter (Signed)
Patient has appt scheduled

## 2021-01-25 NOTE — Patient Instructions (Signed)
Urinary Tract Infection, Adult A urinary tract infection (UTI) is an infection of any part of the urinary tract. The urinary tract includes:  The kidneys.  The ureters.  The bladder.  The urethra. These organs make, store, and get rid of pee (urine) in the body. What are the causes? This infection is caused by germs (bacteria) in your genital area. These germs grow and cause swelling (inflammation) of your urinary tract. What increases the risk? The following factors may make you more likely to develop this condition:  Using a small, thin tube (catheter) to drain pee.  Not being able to control when you pee or poop (incontinence).  Being female. If you are female, these things can increase the risk: ? Using these methods to prevent pregnancy:  A medicine that kills sperm (spermicide).  A device that blocks sperm (diaphragm). ? Having low levels of a female hormone (estrogen). ? Being pregnant. You are more likely to develop this condition if:  You have genes that add to your risk.  You are sexually active.  You take antibiotic medicines.  You have trouble peeing because of: ? A prostate that is bigger than normal, if you are female. ? A blockage in the part of your body that drains pee from the bladder. ? A kidney stone. ? A nerve condition that affects your bladder. ? Not getting enough to drink. ? Not peeing often enough.  You have other conditions, such as: ? Diabetes. ? A weak disease-fighting system (immune system). ? Sickle cell disease. ? Gout. ? Injury of the spine. What are the signs or symptoms? Symptoms of this condition include:  Needing to pee right away.  Peeing small amounts often.  Pain or burning when peeing.  Blood in the pee.  Pee that smells bad or not like normal.  Trouble peeing.  Pee that is cloudy.  Fluid coming from the vagina, if you are female.  Pain in the belly or lower back. Other symptoms include:  Vomiting.  Not  feeling hungry.  Feeling mixed up (confused). This may be the first symptom in older adults.  Being tired and grouchy (irritable).  A fever.  Watery poop (diarrhea). How is this treated?  Taking antibiotic medicine.  Taking other medicines.  Drinking enough water. In some cases, you may need to see a specialist. Follow these instructions at home: Medicines  Take over-the-counter and prescription medicines only as told by your doctor.  If you were prescribed an antibiotic medicine, take it as told by your doctor. Do not stop taking it even if you start to feel better. General instructions  Make sure you: ? Pee until your bladder is empty. ? Do not hold pee for a long time. ? Empty your bladder after sex. ? Wipe from front to back after peeing or pooping if you are a female. Use each tissue one time when you wipe.  Drink enough fluid to keep your pee pale yellow.  Keep all follow-up visits.   Contact a doctor if:  You do not get better after 1-2 days.  Your symptoms go away and then come back. Get help right away if:  You have very bad back pain.  You have very bad pain in your lower belly.  You have a fever.  You have chills.  You feeling like you will vomit or you vomit. Summary  A urinary tract infection (UTI) is an infection of any part of the urinary tract.  This condition is caused by   germs in your genital area.  There are many risk factors for a UTI.  Treatment includes antibiotic medicines.  Drink enough fluid to keep your pee pale yellow. This information is not intended to replace advice given to you by your health care provider. Make sure you discuss any questions you have with your health care provider. Document Revised: 06/16/2020 Document Reviewed: 06/16/2020 Elsevier Patient Education  2021 Elsevier Inc.  

## 2021-01-27 LAB — URINE CULTURE
MICRO NUMBER:: 11632341
SPECIMEN QUALITY:: ADEQUATE

## 2021-01-29 ENCOUNTER — Encounter: Payer: Self-pay | Admitting: Family Medicine

## 2021-02-04 ENCOUNTER — Encounter: Payer: Self-pay | Admitting: Family Medicine

## 2021-02-05 ENCOUNTER — Telehealth: Payer: Self-pay

## 2021-02-05 ENCOUNTER — Other Ambulatory Visit: Payer: Self-pay | Admitting: Family Medicine

## 2021-02-05 ENCOUNTER — Other Ambulatory Visit: Payer: 59

## 2021-02-05 DIAGNOSIS — N3001 Acute cystitis with hematuria: Secondary | ICD-10-CM

## 2021-02-05 MED ORDER — CIPROFLOXACIN HCL 500 MG PO TABS: 500.0000 mg | ORAL_TABLET | Freq: Two times a day (BID) | ORAL | 0 refills | Status: DC

## 2021-02-05 NOTE — Telephone Encounter (Signed)
Nurse Assessment Nurse: Susy Manor, RN, Megan Date/Time (Eastern Time): 02/04/2021 11:03:17 AM Confirm and document reason for call. If symptomatic, describe symptoms. ---Caller states she finished antibiotic for UTI 2 days ago. She took nitrofurantoin. She is now having frequency, fullness, blood in urine. The symptoms had gone away when on medication. Denies fever. Does the patient have any new or worsening symptoms? ---Yes Will a triage be completed? ---Yes Related visit to physician within the last 2 weeks? ---Yes Does the PT have any chronic conditions? (i.e. diabetes, asthma, this includes High risk factors for pregnancy, etc.) ---No Is this a behavioral health or substance abuse call? ---No Guidelines Guideline Title Affirmed Question Affirmed Notes Nurse Date/Time (Eastern Time) Urinary Symptoms Side (flank) or lower back pain present Susy Manor, RN, Megan 02/04/2021 11:05:01 AM Disp. Time Eilene Ghazi Time) Disposition Final User 02/04/2021 11:10:00 AM See PCP within 24 Hours Yes Susy Manor, RN, Jinny Blossom Caller Disagree/Comply Comply Caller Understands Yes PLEASE NOTE: All timestamps contained within this report are represented as [place] Standard Time. CONFIDENTIALTY NOTICE: This fax transmission is intended only for the addressee. It contains information that is legally privileged, confidential or otherwise protected from use or disclosure. If you are not the intended recipient, you are strictly prohibited from reviewing, disclosing, copying using or disseminating any of this information or taking any action in reliance on or regarding this information. If you have received this fax in error, please notify us immediately by telephone so that we can arrange for its return to [place]. Phone: [phone], Toll-Free: [phone], Fax: [phone] Page: 2 of 2 Call Id: [id number] PreDisposition Did not know what to do Care Advice Given Per Guideline SEE PCP WITHIN 24 HOURS: * IF OFFICE WILL BE OPEN:  You need to be examined within the next 24 hours. Call your doctor (or NP/PA) when the office opens and make an appointment. PAIN MEDICINES: * For pain relief, you can take either acetaminophen, ibuprofen, or naproxen. CALL BACK IF: * Fever occurs * Unable to urinate and bladder feels full * You become worse CARE ADVICE given per Urinary Symptoms (Adult) guideline. Referrals REFERRED TO PCP OFFIC

## 2021-02-05 NOTE — Telephone Encounter (Signed)
Medication has already been picked up from [facility].

## 2021-02-05 NOTE — Telephone Encounter (Signed)
Pt is scheduled for lab appt this am per Dr. Geoffery Spruce message to pt

## 2021-02-06 LAB — URINE CULTURE
MICRO NUMBER:: 11670880
SPECIMEN QUALITY:: ADEQUATE

## 2021-02-21 LAB — COLOGUARD: Cologuard: POSITIVE — AB

## 2021-02-27 ENCOUNTER — Telehealth: Payer: Self-pay

## 2021-03-05 ENCOUNTER — Telehealth: Payer: Self-pay | Admitting: Family Medicine

## 2021-03-05 DIAGNOSIS — R195 Other fecal abnormalities: Secondary | ICD-10-CM

## 2021-03-05 NOTE — Telephone Encounter (Signed)
Gave pt message below. Pt voices understanding, and has no questions or concerns.

## 2021-03-05 NOTE — Telephone Encounter (Signed)
Please let her know that her cologuard test came back positive, meaning it detected blood or abnormal DNA biomaker. She will need a colonoscopy to f/u on this. I put in referral to see GI and they will call with this appointment. Please let us know if she doesn't hear from them.   Thanks! D.r Rogers Blocker

## 2021-03-07 LAB — HM DEXA SCAN

## 2021-03-07 LAB — HM MAMMOGRAPHY

## 2021-03-12 ENCOUNTER — Encounter: Payer: Self-pay | Admitting: Family Medicine

## 2021-03-12 NOTE — Telephone Encounter (Signed)
Opened in error

## 2021-03-14 ENCOUNTER — Encounter: Payer: Self-pay | Admitting: Family Medicine

## 2021-03-19 ENCOUNTER — Telehealth: Payer: Self-pay

## 2021-03-19 NOTE — Telephone Encounter (Signed)
Patient is requesting results of colo-guard either threw my chart or printed out in person but  She would like to get detailed results to know what to do next and so she can understand.

## 2021-03-20 NOTE — Telephone Encounter (Signed)
I gave detailed message to pt about cologuard results. She was told on numerous occasions what her results were, and that a referral has been placed for [facility] for a colonoscopy. When results were received from [facility], results were abstracted into Epic and sent to medical records for scanning. I gave her the number for [facility] so that she can have them send her a copy for her records. I recommended that she wait 7-10 business days for scheduling with [facility]. Her referral was placed on 03/05/2021. She was given the number to  GI if she hasn't heard anything or scheduled in that time frame.  She voiced understanding.

## 2021-03-20 NOTE — Telephone Encounter (Signed)
Opened in error

## 2021-03-27 ENCOUNTER — Encounter: Payer: Self-pay | Admitting: Family Medicine

## 2021-04-03 ENCOUNTER — Encounter: Payer: Self-pay | Admitting: Gastroenterology

## 2021-06-07 ENCOUNTER — Other Ambulatory Visit: Payer: Self-pay

## 2021-06-07 ENCOUNTER — Ambulatory Visit (AMBULATORY_SURGERY_CENTER): Payer: Self-pay | Admitting: *Deleted

## 2021-06-07 VITALS — Ht 64.25 in | Wt 113.0 lb

## 2021-06-07 DIAGNOSIS — R195 Other fecal abnormalities: Secondary | ICD-10-CM

## 2021-06-07 MED ORDER — PLENVU 140 G PO SOLR: 1.0000 | ORAL | 0 refills | Status: DC

## 2021-06-07 NOTE — Progress Notes (Signed)
No egg or soy allergy known to patient  No issues with past sedation with any surgeries or procedures Patient denies ever being told they had issues or difficulty with intubation  No FH of Malignant Hyperthermia No diet pills per patient No home 02 use per patient  No blood thinners per patient  Pt denies issues with constipation  No A fib or A flutter  EMMI video to pt or via MyChart  COVID 19 guidelines implemented in PV today with Pt and RN  Pt is fully vaccinated  for Covid   Plenvu Coupon given to pt in PV today , Code to Pharmacy and  NO PA's for preps discussed with pt In PV today  Discussed with pt there will be an out-of-pocket cost for prep and that varies from $0 to 70 dollars   Due to the COVID-19 pandemic we are asking patients to follow certain guidelines.  Pt aware of COVID protocols and [facility] guidelines   

## 2021-06-21 ENCOUNTER — Other Ambulatory Visit: Payer: Self-pay

## 2021-06-21 ENCOUNTER — Ambulatory Visit (AMBULATORY_SURGERY_CENTER): Payer: 59 | Admitting: Gastroenterology

## 2021-06-21 ENCOUNTER — Encounter: Payer: Self-pay | Admitting: Gastroenterology

## 2021-06-21 VITALS — BP 104/60 | HR 62 | Temp 97.8°F | Resp 14 | Ht 64.25 in | Wt 113.0 lb

## 2021-06-21 DIAGNOSIS — D123 Benign neoplasm of transverse colon: Secondary | ICD-10-CM

## 2021-06-21 DIAGNOSIS — D126 Benign neoplasm of colon, unspecified: Secondary | ICD-10-CM | POA: Diagnosis not present

## 2021-06-21 DIAGNOSIS — R195 Other fecal abnormalities: Secondary | ICD-10-CM

## 2021-06-21 DIAGNOSIS — K648 Other hemorrhoids: Secondary | ICD-10-CM | POA: Diagnosis not present

## 2021-06-21 MED ORDER — SODIUM CHLORIDE 0.9 % IV SOLN: 500.0000 mL | Freq: Once | INTRAVENOUS | Status: AC

## 2021-06-21 NOTE — Progress Notes (Signed)
Called to room to assist during endoscopic procedure.  Patient ID and intended procedure confirmed with present staff. Received instructions for my participation in the procedure from the performing physician.  

## 2021-06-21 NOTE — Patient Instructions (Signed)
YOU HAD AN ENDOSCOPIC PROCEDURE TODAY AT THE [facility]:   Refer to the procedure report that was given to you for any specific questions about what was found during the examination.  If the procedure report does not answer your questions, please call your gastroenterologist to clarify.  If you requested that your care partner not be given the details of your procedure findings, then the procedure report has been included in a sealed envelope for you to review at your convenience later.  YOU SHOULD EXPECT: Some feelings of bloating in the abdomen. Passage of more gas than usual.  Walking can help get rid of the air that was put into your GI tract during the procedure and reduce the bloating. If you had a lower endoscopy (such as a colonoscopy or flexible sigmoidoscopy) you may notice spotting of blood in your stool or on the toilet paper. If you underwent a bowel prep for your procedure, you may not have a normal bowel movement for a few days.  Please Note:  You might notice some irritation and congestion in your nose or some drainage.  This is from the oxygen used during your procedure.  There is no need for concern and it should clear up in a day or so.  SYMPTOMS TO REPORT IMMEDIATELY:  Following lower endoscopy (colonoscopy or flexible sigmoidoscopy):  Excessive amounts of blood in the stool  Significant tenderness or worsening of abdominal pains  Swelling of the abdomen that is new, acute  Fever of 100F or higher   For urgent or emergent issues, a gastroenterologist can be reached at any hour by calling [phone]. Do not use MyChart messaging for urgent concerns.    DIET:  We do recommend a small meal at first, but then you may proceed to your regular diet.  Drink plenty of fluids but you should avoid alcoholic beverages for 24 hours.  MEDICATIONS:  Continue present medications.  Please see handouts given to you by your recovery nurse.  Thank you for allowing us to  provide for your healthcare needs today.  ACTIVITY:  You should plan to take it easy for the rest of today and you should NOT DRIVE or use heavy machinery until tomorrow (because of the sedation medicines used during the test).    FOLLOW UP: Our staff will call the number listed on your records 48-72 hours following your procedure to check on you and address any questions or concerns that you may have regarding the information given to you following your procedure. If we do not reach you, we will leave a message.  We will attempt to reach you two times.  During this call, we will ask if you have developed any symptoms of COVID 19. If you develop any symptoms (ie: fever, flu-like symptoms, shortness of breath, cough etc.) before then, please call [phone].  If you test positive for Covid 19 in the 2 weeks post procedure, please call and report this information to us.    If any biopsies were taken you will be contacted by phone or by letter within the next 1-3 weeks.  Please call us at [phone] if you have not heard about the biopsies in 3 weeks.    SIGNATURES/CONFIDENTIALITY: You and/or your care partner have signed paperwork which will be entered into your electronic medical record.  These signatures attest to the fact that that the information above on your After Visit Summary has been reviewed and is understood.  Full responsibility of the   confidentiality of this discharge information lies with you and/or your care-partner.  

## 2021-06-21 NOTE — Op Note (Addendum)
[place] Patient Name: Deborah Wright Procedure Date: 06/21/2021 8:46 AM MRN: [id number] Endoscopist: Mauri Pole , MD Age: 65 Referring MD:  Date of Birth: Aug 22, 1956 Gender: Female Account #: [id number] Procedure:                Colonoscopy Indications:              Positive Cologuard test Medicines:                Monitored Anesthesia Care Procedure:                Pre-Anesthesia Assessment:                           - Prior to the procedure, a History and Physical                            was performed, and patient medications and                            allergies were reviewed. The patient's tolerance of                            previous anesthesia was also reviewed. The risks                            and benefits of the procedure and the sedation                            options and risks were discussed with the patient.                            All questions were answered, and informed consent                            was obtained. Prior Anticoagulants: The patient has                            taken no previous anticoagulant or antiplatelet                            agents. ASA Grade Assessment: II - A patient with                            mild systemic disease. After reviewing the risks                            and benefits, the patient was deemed in                            satisfactory condition to undergo the procedure.                           After obtaining informed consent, the colonoscope  was passed under direct vision. Throughout the                            procedure, the patient's blood pressure, pulse, and                            oxygen saturations were monitored continuously. The                            Olympus PCF-H190DL [id number]) Colonoscope was                            introduced through the anus and advanced to the the                            cecum, identified by appendiceal  orifice and                            ileocecal valve. The colonoscopy was performed                            without difficulty. The patient tolerated the                            procedure well. The quality of the bowel                            preparation was excellent. The ileocecal valve,                            appendiceal orifice, and rectum were photographed. Scope In: 8:57:55 AM Scope Out: 9:14:20 AM Scope Withdrawal Time: 0 hours 9 minutes 23 seconds  Total Procedure Duration: 0 hours 16 minutes 25 seconds  Findings:                 The perianal and digital rectal examinations were                            normal.                           Four sessile polyps were found in the transverse                            colon and hepatic flexure. The polyps were 3 to 4                            mm in size. These polyps were removed with a cold                            snare. Resection and retrieval were complete.                           Non-bleeding external and internal hemorrhoids were  found during retroflexion. The hemorrhoids were                            small.                           The exam was otherwise without abnormality. Complications:            No immediate complications. Estimated Blood Loss:     Estimated blood loss was minimal. Impression:               - Four 3 to 4 mm polyps in the transverse colon and                            at the hepatic flexure, removed with a cold snare.                            Resected and retrieved.                           - Non-bleeding external and internal hemorrhoids.                           - The examination was otherwise normal. Recommendation:           - Patient has a contact number available for                            emergencies. The signs and symptoms of potential                            delayed complications were discussed with the                             patient. Return to normal activities tomorrow.                            Written discharge instructions were provided to the                            patient.                           - Resume previous diet.                           - Continue present medications.                           - Await pathology results.                           - Repeat colonoscopy in 3 - 5 years for                            surveillance based on pathology results. Mauri Pole, MD 06/21/2021 9:17:47 AM This report has been  signed electronically.

## 2021-06-21 NOTE — Progress Notes (Signed)
Vital signs checked by:CW  The patient states no changes in medical or surgical history since pre-visit screening on 06/07/2021.

## 2021-06-21 NOTE — Progress Notes (Signed)
Report to PACU, RN, vss, BBS= Clear.  

## 2021-06-25 ENCOUNTER — Telehealth: Payer: Self-pay

## 2021-06-25 NOTE — Telephone Encounter (Signed)
  Follow up Call-  Call back number 06/21/2021  Post procedure Call Back phone  # [phone]  Permission to leave phone message Yes  Some recent data might be hidden     Patient questions:  Do you have a fever, pain , or abdominal swelling? No. Pain Score  0 *  Have you tolerated food without any problems? Yes.    Have you been able to return to your normal activities? Yes.    Do you have any questions about your discharge instructions: Diet   No. Medications  No. Follow up visit  No.  Do you have questions or concerns about your Care? No.  Actions: * If pain score is 4 or above: No action needed, pain <4.

## 2021-06-25 NOTE — Telephone Encounter (Signed)
Attempted f/u call back. No answer, left VM. 

## 2021-07-10 ENCOUNTER — Encounter: Payer: Self-pay | Admitting: Gastroenterology

## 2021-07-19 DIAGNOSIS — S92343A Displaced fracture of fourth metatarsal bone, unspecified foot, initial encounter for closed fracture: Secondary | ICD-10-CM | POA: Insufficient documentation

## 2021-09-19 ENCOUNTER — Ambulatory Visit (INDEPENDENT_AMBULATORY_CARE_PROVIDER_SITE_OTHER): Payer: 59 | Admitting: Family Medicine

## 2021-09-19 ENCOUNTER — Other Ambulatory Visit (HOSPITAL_COMMUNITY)
Admission: RE | Admit: 2021-09-19 | Discharge: 2021-09-19 | Disposition: A | Discharge status: Home / Self Care | Payer: 59 | Source: Ambulatory Visit | Attending: Family Medicine | Admitting: Family Medicine

## 2021-09-19 ENCOUNTER — Other Ambulatory Visit: Payer: Self-pay

## 2021-09-19 ENCOUNTER — Encounter: Payer: Self-pay | Admitting: Family Medicine

## 2021-09-19 VITALS — BP 123/82 | Ht 64.5 in | Wt 117.8 lb

## 2021-09-19 DIAGNOSIS — Z01419 Encounter for gynecological examination (general) (routine) without abnormal findings: Secondary | ICD-10-CM | POA: Diagnosis present

## 2021-09-19 DIAGNOSIS — M8000XD Age-related osteoporosis with current pathological fracture, unspecified site, subsequent encounter for fracture with routine healing: Secondary | ICD-10-CM

## 2021-09-19 DIAGNOSIS — Q825 Congenital non-neoplastic nevus: Secondary | ICD-10-CM | POA: Insufficient documentation

## 2021-09-19 DIAGNOSIS — Z124 Encounter for screening for malignant neoplasm of cervix: Secondary | ICD-10-CM

## 2021-09-19 NOTE — Progress Notes (Signed)
Subjective:     Deborah Wright is a 65 y.o. female and is here for a comprehensive physical exam. The patient reports problems - recent metatarsal fracture with known osteoporosis. On Calcium and Vitamin D. Last T score was -2.8. she is considering Prolia at request of ortho.   The following portions of the patient's history were reviewed and updated as appropriate: allergies, current medications, past family history, past medical history, past social history, past surgical history, and problem list.  Review of Systems Pertinent items noted in HPI and remainder of comprehensive ROS otherwise negative.   Objective:    BP 123/82   Ht 5' 4.5" (1.638 m)   Wt 117 lb 12.8 oz (53.4 kg)   BMI 19.91 kg/m  General appearance: alert, cooperative, and appears stated age Head: Normocephalic, without obvious abnormality, atraumatic Neck: no adenopathy, supple, symmetrical, trachea midline, and thyroid not enlarged, symmetric, no tenderness/mass/nodules Lungs: clear to auscultation bilaterally Breasts: normal appearance, no masses or tenderness Heart: regular rate and rhythm, S1, S2 normal, no murmur, click, rub or gallop Abdomen: soft, non-tender; bowel sounds normal; no masses,  no organomegaly Pelvic: cervix normal in appearance, external genitalia normal, no adnexal masses or tenderness, no cervical motion tenderness, uterus normal size, shape, and consistency, and vagina normal without discharge Extremities: extremities normal, atraumatic, no cyanosis or edema Pulses: 2+ and symmetric Skin: Skin color, texture, turgor normal. No rashes or lesions Lymph nodes: Cervical, supraclavicular, and axillary nodes normal. Neurologic: Grossly normal    Assessment:    GYN female exam.      Plan:   Problem List Items Addressed This Visit       Unprioritized   Osteoporosis    Considering Prolia--or if insurance won't cover, may try fosamax      Relevant Medications   Calcium Carbonate-Vit D-Min  (CALCIUM 1200 PO)   Investigational vitamin D 600 UNITS capsule SWOG [id number]   Other Visit Diagnoses     Well woman exam    -  Primary   Relevant Orders   Cytology - PAP( [place])   Screening for malignant neoplasm of cervix       Encounter for gynecological examination without abnormal finding          Return in 1 year (on 09/19/2022).    See After Visit Summary for Counseling Recommendations

## 2021-09-20 DIAGNOSIS — M810 Age-related osteoporosis without current pathological fracture: Secondary | ICD-10-CM | POA: Insufficient documentation

## 2021-09-20 NOTE — Assessment & Plan Note (Signed)
Considering Prolia--or if insurance won't cover, may try fosamax

## 2021-09-26 LAB — CYTOLOGY - PAP
Comment: NEGATIVE
Diagnosis: NEGATIVE
High risk HPV: NEGATIVE

## 2022-06-26 ENCOUNTER — Encounter (INDEPENDENT_AMBULATORY_CARE_PROVIDER_SITE_OTHER): Payer: Self-pay

## 2022-09-13 ENCOUNTER — Other Ambulatory Visit (HOSPITAL_BASED_OUTPATIENT_CLINIC_OR_DEPARTMENT_OTHER): Payer: Self-pay

## 2022-09-13 MED ORDER — INFLUENZA VAC SPLIT QUAD 0.5 ML IM SUSY
PREFILLED_SYRINGE | INTRAMUSCULAR | 0 refills | Status: AC
  Filled 2022-09-13: qty 0.5, 1d supply, fill #0

## 2023-01-17 ENCOUNTER — Ambulatory Visit (INDEPENDENT_AMBULATORY_CARE_PROVIDER_SITE_OTHER): Payer: 59 | Admitting: Podiatry

## 2023-01-17 ENCOUNTER — Ambulatory Visit (INDEPENDENT_AMBULATORY_CARE_PROVIDER_SITE_OTHER): Payer: 59

## 2023-01-17 ENCOUNTER — Encounter: Payer: Self-pay | Admitting: Podiatry

## 2023-01-17 DIAGNOSIS — G5762 Lesion of plantar nerve, left lower limb: Secondary | ICD-10-CM

## 2023-01-17 MED ORDER — DEXAMETHASONE SODIUM PHOSPHATE 120 MG/30ML IJ SOLN
4.0000 mg | Freq: Once | INTRAMUSCULAR | Status: AC
  Administered 2023-01-17: 4 mg via INTRA_ARTICULAR

## 2023-01-17 NOTE — Progress Notes (Signed)
  Subjective:  Patient ID: Deborah Wright, female    DOB: 09-08-1956,   MRN: [id number]  Chief Complaint  Patient presents with   Foot Pain    Left foot pain , Patient states pain has been on going for about 1 week. Pain is mostly at the ball of the left foot    67 y.o. female presents for concern of left foot pain that has been present for about a week in the ball of her foot. Relates she is very active and on her feet and wears lots of tennis shoes. Relates this has bene hurting mostly in the morning and has gotten better throughout the day. She is in PT currently for pack pain and on meloxicam . Denies any other pedal complaints. Denies n/v/f/c.   Past Medical History:  Diagnosis Date   Closed fracture of navicular bone of foot 06/17/2016   Estrogen deficiency    Hyperlipidemia    no meds   Osteoporosis    PONV (postoperative nausea and vomiting)    post op Colon with Demerol and Versed    Objective:  Physical Exam: Vascular: DP/PT pulses 2/4 bilateral. CFT <3 seconds. Normal hair growth on digits. No edema.  Skin. No lacerations or abrasions bilateral feet.  Musculoskeletal: MMT 5/5 bilateral lower extremities in DF, PF, Inversion and Eversion. Deceased ROM in DF of ankle joint.  Tender to third interspace upon palpation. Some pain over the third MPJ and fourth MPJ. Plantarly. Positive mulders click and pain with metatrsal squeeze.  Neurological: Sensation intact to light touch.   Assessment:   1. Morton's neuroma, left      Plan:  Patient was evaluated and treated and all questions answered. X-rays reviewed and discussed with patient. No acute fractures or dislocations noted. Mild collapse of the medial arch noted.  Discussed neuroma and treatment options with patient.  Injection offered today. Patient in agreement. Procedure below.  Discussed padding and offloading today.  Continue meloxicam.  Discussed if pain does not improve may consider  MRI for further surgical  planning.  Patient to return in 6 weeks or sooner if concerns arise.    Procedure: Injection Tendon/Ligament Discussed alternatives, risks, complications and verbal consent was obtained.  Location: Left third interspace. Skin Prep: Alcohol. Injectate: 1cc 0.5% marcaine plain, 1 cc dexamethasone.  Disposition: Patient tolerated procedure well. Injection site dressed with a band-aid.  Post-injection care was discussed and return precautions discussed.     Lorenda Peck, DPM

## 2023-02-25 ENCOUNTER — Ambulatory Visit (INDEPENDENT_AMBULATORY_CARE_PROVIDER_SITE_OTHER): Payer: 59 | Admitting: Podiatry

## 2023-02-25 ENCOUNTER — Encounter: Payer: Self-pay | Admitting: Podiatry

## 2023-02-25 DIAGNOSIS — G5762 Lesion of plantar nerve, left lower limb: Secondary | ICD-10-CM

## 2023-02-25 MED ORDER — DEXAMETHASONE SODIUM PHOSPHATE 120 MG/30ML IJ SOLN
4.0000 mg | Freq: Once | INTRAMUSCULAR | Status: AC
  Administered 2023-02-25: 4 mg via INTRA_ARTICULAR

## 2023-02-25 NOTE — Progress Notes (Signed)
  Subjective:  Patient ID: Deborah Wright, female    DOB: 09-22-1956,   MRN: [id number]  Chief Complaint  Patient presents with   Neuroma     Left foot pain follow-up, rate of pain 3 out of 10, Pain is mostly at the ball of the left foot, injectio has helped, patient uses the pads when she feels the pain,       67 y.o. female presents for follow-up of left foot morton/s neuroma. Relates injection did help and pain is improved but still comes and goes. She has been using the padding.  She is in PT currently for back pain and on meloxicam . Denies any other pedal complaints. Denies n/v/f/c.   Past Medical History:  Diagnosis Date   Closed fracture of navicular bone of foot 06/17/2016   Estrogen deficiency    Hyperlipidemia    no meds   Osteoporosis    PONV (postoperative nausea and vomiting)    post op Colon with Demerol and Versed    Objective:  Physical Exam: Vascular: DP/PT pulses 2/4 bilateral. CFT <3 seconds. Normal hair growth on digits. No edema.  Skin. No lacerations or abrasions bilateral feet.  Musculoskeletal: MMT 5/5 bilateral lower extremities in DF, PF, Inversion and Eversion. Deceased ROM in DF of ankle joint.  Tender to third interspace upon palpation. Some pain over the third MPJ and fourth MPJ. Plantarly. Positive mulders click and pain with metatrsal squeeze.  Neurological: Sensation intact to light touch.   Assessment:   1. Morton's neuroma, left       Plan:  Patient was evaluated and treated and all questions answered. X-rays reviewed and discussed with patient. No acute fractures or dislocations noted. Mild collapse of the medial arch noted.  Discussed neuroma and treatment options with patient.  Injection offered today. Patient in agreement. Procedure below.  Discussed padding and offloading today.  Continue meloxicam.  Discussed if pain does not improve may consider  MRI for further surgical planning.  Discussed looking into CMO  Patient to return  in 6 weeks or sooner if concerns arise.    Procedure: Injection Tendon/Ligament Discussed alternatives, risks, complications and verbal consent was obtained.  Location: Left third interspace. Skin Prep: Alcohol. Injectate: 1cc 0.5% marcaine plain, 1 cc dexamethasone.  Disposition: Patient tolerated procedure well. Injection site dressed with a band-aid.  Post-injection care was discussed and return precautions discussed.     Louann Sjogren, DPM

## 2023-02-27 ENCOUNTER — Ambulatory Visit: Payer: 59 | Admitting: Podiatry

## 2023-02-28 ENCOUNTER — Ambulatory Visit: Payer: 59 | Admitting: Podiatry

## 2023-03-31 ENCOUNTER — Other Ambulatory Visit: Payer: Self-pay | Admitting: Obstetrics and Gynecology

## 2023-03-31 DIAGNOSIS — M810 Age-related osteoporosis without current pathological fracture: Secondary | ICD-10-CM

## 2023-04-07 ENCOUNTER — Ambulatory Visit (INDEPENDENT_AMBULATORY_CARE_PROVIDER_SITE_OTHER): Payer: 59

## 2023-04-07 DIAGNOSIS — G5762 Lesion of plantar nerve, left lower limb: Secondary | ICD-10-CM

## 2023-04-07 NOTE — Progress Notes (Signed)
Patient presents today to be casted for custom molded orthotics. Ralene Cork is the treating physician.  Impression scan cast was taken. ABN signed.  Patient info-  Shoe size: 10   Shoe style: ATHLETIC  Height: 5FT 4IN  Weight: 116  Insurance: [employer]   Patient will be notified once orthotics arrive in office and reappoint for fitting at that time.

## 2023-04-17 ENCOUNTER — Other Ambulatory Visit: Payer: 59

## 2024-01-27 ENCOUNTER — Other Ambulatory Visit: Payer: Self-pay | Admitting: Obstetrics and Gynecology

## 2024-01-27 DIAGNOSIS — Z1231 Encounter for screening mammogram for malignant neoplasm of breast: Secondary | ICD-10-CM

## 2024-03-23 ENCOUNTER — Other Ambulatory Visit (HOSPITAL_BASED_OUTPATIENT_CLINIC_OR_DEPARTMENT_OTHER): Payer: Self-pay

## 2024-03-23 MED ORDER — CLOBETASOL PROPIONATE 0.05 % EX OINT
1.0000 | TOPICAL_OINTMENT | Freq: Two times a day (BID) | CUTANEOUS | 0 refills | Status: AC
Start: 2024-03-23 — End: ?
  Filled 2024-03-23: qty 15, 30d supply, fill #0

## 2024-04-06 ENCOUNTER — Other Ambulatory Visit (HOSPITAL_BASED_OUTPATIENT_CLINIC_OR_DEPARTMENT_OTHER): Payer: Self-pay

## 2024-04-06 MED ORDER — ESTRING 7.5 MCG/24HR VA RING: VAGINAL_RING | VAGINAL | 3 refills | Status: AC

## 2024-04-06 MED ORDER — ESTRING 7.5 MCG/24HR VA RING
VAGINAL_RING | VAGINAL | 4 refills | Status: AC
Start: 2024-04-06 — End: ?
  Filled 2024-04-06: qty 3, 90d supply, fill #0
